# Patient Record
Sex: Female | Born: 1960 | Race: White | Hispanic: No | Smoking: Former smoker
Health system: Southern US, Community
[De-identification: ages and names within clinical notes are randomized; demographics above are authoritative.]

## PROBLEM LIST (undated history)

## (undated) HISTORY — PX: APPENDECTOMY: SHX54

## (undated) HISTORY — PX: TUBAL LIGATION: SHX77

## (undated) HISTORY — PX: CHOLECYSTECTOMY: SHX55

## (undated) HISTORY — PX: ABDOMINAL HYSTERECTOMY: SHX81

## (undated) HISTORY — PX: FRACTURE SURGERY: SHX138

---

## 2004-01-14 ENCOUNTER — Emergency Department: Payer: Self-pay | Admitting: Emergency Medicine

## 2004-01-20 ENCOUNTER — Emergency Department: Payer: Self-pay | Admitting: General Practice

## 2004-02-13 ENCOUNTER — Emergency Department: Payer: Self-pay | Admitting: Emergency Medicine

## 2004-03-03 ENCOUNTER — Ambulatory Visit: Payer: Self-pay | Admitting: General Surgery

## 2004-03-11 ENCOUNTER — Emergency Department: Payer: Self-pay | Admitting: Emergency Medicine

## 2006-08-26 ENCOUNTER — Emergency Department: Payer: Self-pay | Admitting: Emergency Medicine

## 2008-07-02 ENCOUNTER — Emergency Department: Payer: Self-pay | Admitting: Emergency Medicine

## 2010-09-23 ENCOUNTER — Emergency Department: Payer: Self-pay | Admitting: Emergency Medicine

## 2010-11-12 ENCOUNTER — Emergency Department: Payer: Self-pay | Admitting: *Deleted

## 2011-01-04 ENCOUNTER — Emergency Department: Payer: Self-pay | Admitting: Emergency Medicine

## 2011-02-26 ENCOUNTER — Emergency Department: Payer: Self-pay | Admitting: *Deleted

## 2013-04-19 ENCOUNTER — Emergency Department: Payer: Self-pay | Admitting: Emergency Medicine

## 2013-04-19 LAB — URINALYSIS, COMPLETE
BACTERIA: NONE SEEN
Bilirubin,UR: NEGATIVE
GLUCOSE, UR: NEGATIVE mg/dL (ref 0–75)
Ketone: NEGATIVE
NITRITE: NEGATIVE
PH: 5 (ref 4.5–8.0)
PROTEIN: NEGATIVE
RBC,UR: <1 /HPF (ref 0–5)
Specific Gravity: 1.019 (ref 1.003–1.030)
WBC UR: 3 /HPF (ref 0–5)

## 2013-04-19 LAB — COMPREHENSIVE METABOLIC PANEL
ALK PHOS: 69 U/L
Albumin: 3.9 g/dL (ref 3.4–5.0)
Anion Gap: 0 — ABNORMAL LOW (ref 7–16)
BUN: 16 mg/dL (ref 7–18)
Bilirubin,Total: 0.4 mg/dL (ref 0.2–1.0)
CO2: 31 mmol/L (ref 21–32)
Calcium, Total: 9.3 mg/dL (ref 8.5–10.1)
Chloride: 107 mmol/L (ref 98–107)
Creatinine: 0.89 mg/dL (ref 0.60–1.30)
EGFR (African American): >60
EGFR (Non-African Amer.): >60
Glucose: 91 mg/dL (ref 65–99)
OSMOLALITY: 276 (ref 275–301)
POTASSIUM: 3.4 mmol/L — AB (ref 3.5–5.1)
SGOT(AST): 24 U/L (ref 15–37)
SGPT (ALT): 22 U/L (ref 12–78)
SODIUM: 138 mmol/L (ref 136–145)
Total Protein: 8 g/dL (ref 6.4–8.2)

## 2013-04-19 LAB — CBC WITH DIFFERENTIAL/PLATELET
Basophil #: 0.1 10*3/uL (ref 0.0–0.1)
Basophil %: 1.2 %
EOS PCT: 1.7 %
Eosinophil #: 0.1 10*3/uL (ref 0.0–0.7)
HCT: 36.5 % (ref 35.0–47.0)
HGB: 12.4 g/dL (ref 12.0–16.0)
LYMPHS PCT: 23.4 %
Lymphocyte #: 1.8 10*3/uL (ref 1.0–3.6)
MCH: 33.4 pg (ref 26.0–34.0)
MCHC: 33.9 g/dL (ref 32.0–36.0)
MCV: 99 fL (ref 80–100)
Monocyte #: 0.5 x10 3/mm (ref 0.2–0.9)
Monocyte %: 6.4 %
Neutrophil #: 5.1 10*3/uL (ref 1.4–6.5)
Neutrophil %: 67.3 %
Platelet: 243 10*3/uL (ref 150–440)
RBC: 3.7 10*6/uL — AB (ref 3.80–5.20)
RDW: 13.3 % (ref 11.5–14.5)
WBC: 7.6 10*3/uL (ref 3.6–11.0)

## 2013-06-20 ENCOUNTER — Emergency Department: Payer: Self-pay | Admitting: Emergency Medicine

## 2013-06-20 LAB — CBC WITH DIFFERENTIAL/PLATELET
BASOS ABS: 0.1 10*3/uL (ref 0.0–0.1)
BASOS PCT: 1.1 %
Eosinophil #: 0.2 10*3/uL (ref 0.0–0.7)
Eosinophil %: 1.8 %
HCT: 40.8 % (ref 35.0–47.0)
HGB: 12.6 g/dL (ref 12.0–16.0)
Lymphocyte #: 1.1 10*3/uL (ref 1.0–3.6)
Lymphocyte %: 11.2 %
MCH: 30.5 pg (ref 26.0–34.0)
MCHC: 31 g/dL — ABNORMAL LOW (ref 32.0–36.0)
MCV: 99 fL (ref 80–100)
MONOS PCT: 5 %
Monocyte #: 0.5 x10 3/mm (ref 0.2–0.9)
NEUTROS PCT: 80.9 %
Neutrophil #: 8.3 10*3/uL — ABNORMAL HIGH (ref 1.4–6.5)
PLATELETS: 374 10*3/uL (ref 150–440)
RBC: 4.14 10*6/uL (ref 3.80–5.20)
RDW: 14.5 % (ref 11.5–14.5)
WBC: 10.3 10*3/uL (ref 3.6–11.0)

## 2013-06-20 LAB — COMPREHENSIVE METABOLIC PANEL
ALBUMIN: 4 g/dL (ref 3.4–5.0)
ALT: 23 U/L (ref 12–78)
ANION GAP: 6 — AB (ref 7–16)
Alkaline Phosphatase: 72 U/L
BUN: 22 mg/dL — AB (ref 7–18)
Bilirubin,Total: 0.6 mg/dL (ref 0.2–1.0)
CREATININE: 1.07 mg/dL (ref 0.60–1.30)
Calcium, Total: 9.6 mg/dL (ref 8.5–10.1)
Chloride: 102 mmol/L (ref 98–107)
Co2: 28 mmol/L (ref 21–32)
EGFR (African American): >60
EGFR (Non-African Amer.): >60
Glucose: 109 mg/dL — ABNORMAL HIGH (ref 65–99)
OSMOLALITY: 276 (ref 275–301)
POTASSIUM: 3.9 mmol/L (ref 3.5–5.1)
SGOT(AST): 24 U/L (ref 15–37)
SODIUM: 136 mmol/L (ref 136–145)
Total Protein: 8.8 g/dL — ABNORMAL HIGH (ref 6.4–8.2)

## 2014-04-09 ENCOUNTER — Emergency Department: Payer: Self-pay | Admitting: Student

## 2014-08-07 ENCOUNTER — Emergency Department
Admission: EM | Admit: 2014-08-07 | Discharge: 2014-08-07 | Disposition: A | Discharge status: Home / Self Care | Payer: Self-pay | Attending: Emergency Medicine | Admitting: Emergency Medicine

## 2014-08-07 ENCOUNTER — Encounter: Payer: Self-pay | Admitting: Emergency Medicine

## 2014-08-07 DIAGNOSIS — K088 Other specified disorders of teeth and supporting structures: Secondary | ICD-10-CM | POA: Insufficient documentation

## 2014-08-07 DIAGNOSIS — K089 Disorder of teeth and supporting structures, unspecified: Secondary | ICD-10-CM

## 2014-08-07 DIAGNOSIS — G8929 Other chronic pain: Secondary | ICD-10-CM | POA: Insufficient documentation

## 2014-08-07 MED ORDER — PENICILLIN V POTASSIUM 250 MG PO TABS: 250.0000 mg | ORAL_TABLET | Freq: Three times a day (TID) | ORAL | Status: DC

## 2014-08-07 MED ORDER — TRAMADOL HCL 50 MG PO TABS: 50.0000 mg | ORAL_TABLET | Freq: Four times a day (QID) | ORAL | Status: DC | PRN

## 2014-08-07 NOTE — ED Provider Notes (Signed)
[facility] Emergency Department Provider Note  ____________________________________________  Time seen: Approximately 10:21 AM  I have reviewed the triage vital signs and the nursing notes.   HISTORY  Chief Complaint Dental Pain    HPI Ariana GurneyKimberly A Cook is a 54 y.o. female who presents to the emergency department for chronic dental pain. She states the pain in the right lower jaw has become worse over the past couple weeks. She has been unable to get to the dentist due to transportation and insurance issues.   History reviewed. No pertinent past medical history.  There are no active problems to display for this patient.   Past Surgical History  Procedure Laterality Date  . Abdominal hysterectomy      Current Outpatient Rx  Name  Route  Sig  Dispense  Refill  . penicillin v potassium (VEETID) 250 MG tablet   Oral   Take 1 tablet (250 mg total) by mouth 3 (three) times daily.   30 tablet   0   . traMADol (ULTRAM) 50 MG tablet   Oral   Take 1 tablet (50 mg total) by mouth every 6 (six) hours as needed.   9 tablet   0     Allergies Review of patient's allergies indicates no known allergies.  No family history on file.  Social History History  Substance Use Topics  . Smoking status: Never Smoker   . Smokeless tobacco: Not on file  . Alcohol Use: No    Review of Systems Constitutional: No fever/chills ENT: No sore throat. Cardiovascular: Denies chest pain. Respiratory: Denies shortness of breath. Gastrointestinal: No abdominal pain.  No nausea, no vomiting.  No diarrhea.  No constipation. Musculoskeletal: Negative for pain. Skin: Negative for rash. Neurological: Negative for headaches, focal weakness or numbness.  10-point ROS otherwise negative.  ____________________________________________   PHYSICAL EXAM:  VITAL SIGNS: ED Triage Vitals  Enc Vitals Group     BP 08/07/14 0951 130/84 mmHg     Pulse Rate 08/07/14 0951 88      Resp 08/07/14 0951 18     Temp 08/07/14 0951 97.8 F (36.6 C)     Temp Source 08/07/14 0951 Oral     SpO2 08/07/14 0951 100 %     Weight 08/07/14 0951 164 lb (74.39 kg)     Height 08/07/14 0951 5\' 2"  (1.575 m)     Head Cir --      Peak Flow --      Pain Score 08/07/14 1000 10     Pain Loc --      Pain Edu? --      Excl. in GC? --     Constitutional: Alert and oriented. Well appearing and in no acute distress. Eyes: Conjunctivae are normal. PERRL. EOMI. Head: Atraumatic. Nose: No congestion/rhinnorhea. Mouth/Throat: Mucous membranes are moist.  Oropharynx non-erythematous. Widespread chronic dental decay without obvious abscess. No facial swelling. Neck: No stridor.   Cardiovascular:  Good peripheral circulation. Respiratory: Normal respiratory effort.  No retractions. Musculoskeletal: No lower extremity tenderness nor edema.   Neurologic:  Normal speech and language. No gross focal neurologic deficits are appreciated. Speech is normal. No gait instability. Skin:  Skin is warm, dry and intact. No rash noted. Psychiatric: Mood and affect are normal. Speech and behavior are normal.  ____________________________________________   LABS (all labs ordered are listed, but only abnormal results are displayed)  Labs Reviewed - No data to display ____________________________________________  EKG   ____________________________________________  RADIOLOGY  ____________________________________________   PROCEDURES  Procedure(s) performed: None  Critical Care performed: No  ____________________________________________   INITIAL IMPRESSION / ASSESSMENT AND PLAN / ED COURSE  Pertinent labs & imaging results that were available during my care of the patient were reviewed by me and considered in my medical decision making (see chart for details).  Patient was advised to follow-up with the dentist as soon as possible. She advises she plans to take a bus to [facility] next  Wednesday. She was advised to return to emergency department for symptoms that change or worsen if she is unable to schedule an appointment. ____________________________________________   FINAL CLINICAL IMPRESSION(S) / ED DIAGNOSES  Final diagnoses:  Chronic dental pain      Ariana Pester, FNP 08/07/14 1038

## 2014-08-07 NOTE — ED Notes (Signed)
Pt with dental pain for two weeks. Unable to see dentist d/t insurance.

## 2015-05-24 ENCOUNTER — Emergency Department
Admission: EM | Admit: 2015-05-24 | Discharge: 2015-05-24 | Disposition: A | Discharge status: Home / Self Care | Payer: Self-pay | Attending: Emergency Medicine | Admitting: Emergency Medicine

## 2015-05-24 ENCOUNTER — Emergency Department: Payer: Self-pay

## 2015-05-24 ENCOUNTER — Encounter: Payer: Self-pay | Admitting: Medical Oncology

## 2015-05-24 DIAGNOSIS — M25562 Pain in left knee: Secondary | ICD-10-CM | POA: Insufficient documentation

## 2015-05-24 DIAGNOSIS — Z792 Long term (current) use of antibiotics: Secondary | ICD-10-CM | POA: Insufficient documentation

## 2015-05-24 DIAGNOSIS — Z90710 Acquired absence of both cervix and uterus: Secondary | ICD-10-CM | POA: Insufficient documentation

## 2015-05-24 MED ORDER — NAPROXEN 500 MG PO TABS: 500.0000 mg | ORAL_TABLET | Freq: Two times a day (BID) | ORAL | Status: DC

## 2015-05-24 MED ORDER — OXYCODONE-ACETAMINOPHEN 5-325 MG PO TABS: 1.0000 | ORAL_TABLET | ORAL | Status: DC | PRN

## 2015-05-24 MED ORDER — TRAMADOL HCL 50 MG PO TABS
50.0000 mg | ORAL_TABLET | Freq: Once | ORAL | Status: AC
  Administered 2015-05-24: 50 mg via ORAL
  Filled 2015-05-24: qty 1

## 2015-05-24 MED ORDER — NAPROXEN 500 MG PO TABS
500.0000 mg | ORAL_TABLET | Freq: Once | ORAL | Status: AC
  Administered 2015-05-24: 500 mg via ORAL
  Filled 2015-05-24: qty 1

## 2015-05-24 NOTE — ED Provider Notes (Signed)
[facility] Emergency Department Provider Note  ____________________________________________  Time seen: Approximately 3:39 PM  I have reviewed the triage vital signs and the nursing notes.   HISTORY  Chief Complaint Knee Pain    HPI TEMPERENCE ZENOR is a 55 y.o. female patient complaining of 6 days of left inferior anterior knee pain. Patient denies any provocative incident for this pain. Patient stated the pain has increased in the past 2 days. Patient state the pain causes nausea. Patient describes a sharp pain that starts at the anterior medial aspect of the knee and radiates down her leg. Patient rates the pain as a 10 over 10. No palliative measures taken for this complaint.No complaint with the right knee.   History reviewed. No pertinent past medical history.  There are no active problems to display for this patient.   Past Surgical History  Procedure Laterality Date  . Abdominal hysterectomy      Current Outpatient Rx  Name  Route  Sig  Dispense  Refill  . naproxen (NAPROSYN) 500 MG tablet   Oral   Take 1 tablet (500 mg total) by mouth 2 (two) times daily with a meal.   20 tablet   0   . oxyCODONE-acetaminophen (ROXICET) 5-325 MG tablet   Oral   Take 1 tablet by mouth every 4 (four) hours as needed for severe pain.   30 tablet   0   . penicillin v potassium (VEETID) 250 MG tablet   Oral   Take 1 tablet (250 mg total) by mouth 3 (three) times daily.   30 tablet   0   . traMADol (ULTRAM) 50 MG tablet   Oral   Take 1 tablet (50 mg total) by mouth every 6 (six) hours as needed.   9 tablet   0     Allergies Review of patient's allergies indicates no known allergies.  No family history on file.  Social History Social History  Substance Use Topics  . Smoking status: Never Smoker   . Smokeless tobacco: None  . Alcohol Use: No    Review of Systems Constitutional: No fever/chills Eyes: No visual changes. ENT: No sore  throat. Cardiovascular: Denies chest pain. Respiratory: Denies shortness of breath. Gastrointestinal: No abdominal pain.  No nausea, no vomiting.  No diarrhea.  No constipation. Genitourinary: Negative for dysuria. Musculoskeletal: Left knee pain  Skin: Negative for rash. Neurological: Negative for headaches, focal weakness or numbness.    ____________________________________________   PHYSICAL EXAM:  VITAL SIGNS: ED Triage Vitals  Enc Vitals Group     BP 05/24/15 1505 148/87 mmHg     Pulse Rate 05/24/15 1505 95     Resp 05/24/15 1505 17     Temp 05/24/15 1505 98.1 F (36.7 C)     Temp Source 05/24/15 1505 Oral     SpO2 05/24/15 1505 98 %     Weight 05/24/15 1505 164 lb (74.39 kg)     Height 05/24/15 1505  (1.575 m)     Head Cir --      Peak Flow --      Pain Score 05/24/15 1505 10     Pain Loc --      Pain Edu? --      Excl. in GC? --     Constitutional: Alert and oriented. Well appearing and in no acute distress. Eyes: Conjunctivae are normal. PERRL. EOMI. Head: Atraumatic. Nose: No congestion/rhinnorhea. Mouth/Throat: Mucous membranes are moist.  Oropharynx non-erythematous. Neck: No  stridor.  No cervical spine tenderness to palpation. Hematological/Lymphatic/Immunilogical: No cervical lymphadenopathy. Cardiovascular: Normal rate, regular rhythm. Grossly normal heart sounds.  Good peripheral circulation. Respiratory: Normal respiratory effort.  No retractions. Lungs CTAB. Gastrointestinal: Soft and nontender. No distention. No abdominal bruits. No CVA tenderness. Musculoskeletal: No lower extremity tenderness nor edema.  No joint effusions. Neurologic:  Normal speech and language. No gross focal neurologic deficits are appreciated. No gait instability. Skin:  Skin is warm, dry and intact. No rash noted. Psychiatric: Mood and affect are normal. Speech and behavior are normal.  ____________________________________________   LABS (all labs ordered are  listed, but only abnormal results are displayed)  Labs Reviewed - No data to display ____________________________________________  EKG   ____________________________________________  RADIOLOGY  No acute findings on x-ray ____________________________________________   PROCEDURES  Procedure(s) performed: None  Critical Care performed: No  ____________________________________________   INITIAL IMPRESSION / ASSESSMENT AND PLAN / ED COURSE  Pertinent labs & imaging results that were available during my care of the patient were reviewed by me and considered in my medical decision making (see chart for details).  Arthralgia left knee. Discussed negative x-ray finding with patient. Advised patient to purchase elastic knee support with a 3-5 days. Patient given a prescription for naproxen, Percocet and advised [facility] if [facility] condition persists.   FINAL CLINICAL IMPRESSION(S) / ED DIAGNOSES  Final diagnoses:  Patellofemoral arthralgia of left knee      Joni ReiningRonald K Smith, PA-C 05/24/15 1724  Myrna Blazeravid Matthew Schaevitz, MD 05/25/15 [phone]

## 2015-05-24 NOTE — Discharge Instructions (Signed)
Advised last knee support for 3-5 days.

## 2015-05-24 NOTE — ED Notes (Signed)
Pt ambulatory with reports of left knee pain since Monday. Denies injury.

## 2017-05-23 ENCOUNTER — Encounter: Payer: Self-pay | Admitting: Emergency Medicine

## 2017-05-23 ENCOUNTER — Other Ambulatory Visit: Payer: Self-pay

## 2017-05-23 ENCOUNTER — Emergency Department
Admission: EM | Admit: 2017-05-23 | Discharge: 2017-05-23 | Disposition: A | Discharge status: Home / Self Care | Payer: Self-pay | Attending: Emergency Medicine | Admitting: Emergency Medicine

## 2017-05-23 DIAGNOSIS — L600 Ingrowing nail: Secondary | ICD-10-CM | POA: Insufficient documentation

## 2017-05-23 MED ORDER — IBUPROFEN 600 MG PO TABS: 600.0000 mg | ORAL_TABLET | Freq: Four times a day (QID) | ORAL | 0 refills | Status: DC | PRN

## 2017-05-23 MED ORDER — SULFAMETHOXAZOLE-TRIMETHOPRIM 800-160 MG PO TABS
1.0000 | ORAL_TABLET | Freq: Once | ORAL | Status: AC
  Administered 2017-05-23: 1 via ORAL
  Filled 2017-05-23: qty 1

## 2017-05-23 MED ORDER — OXYCODONE-ACETAMINOPHEN 5-325 MG PO TABS: 1.0000 | ORAL_TABLET | Freq: Four times a day (QID) | ORAL | 0 refills | Status: DC | PRN

## 2017-05-23 MED ORDER — OXYCODONE-ACETAMINOPHEN 5-325 MG PO TABS
1.0000 | ORAL_TABLET | Freq: Once | ORAL | Status: AC
  Administered 2017-05-23: 1 via ORAL
  Filled 2017-05-23: qty 1

## 2017-05-23 MED ORDER — IBUPROFEN 600 MG PO TABS
600.0000 mg | ORAL_TABLET | Freq: Once | ORAL | Status: AC
  Administered 2017-05-23: 600 mg via ORAL
  Filled 2017-05-23: qty 1

## 2017-05-23 MED ORDER — SULFAMETHOXAZOLE-TRIMETHOPRIM 800-160 MG PO TABS: 1.0000 | ORAL_TABLET | Freq: Two times a day (BID) | ORAL | 0 refills | Status: DC

## 2017-05-23 NOTE — ED Provider Notes (Signed)
[facility] Emergency Department Provider Note   ____________________________________________   First MD Initiated Contact with Patient 05/23/17 1116     (approximate)  I have reviewed the triage vital signs and the nursing notes.   HISTORY  Chief Complaint Toe Pain    HPI Ariana Cook is a 57 y.o. female patient complaining of infected right ingrown toenail.  For 4 days.  Patient had no relief over-the-counter anti-inflammatory medications.  Patient denies any drainage at this time.  Patient states toe is red and swollen.  Patient rates the pain as a 10/10.  Patient described the pain is "burning".   History reviewed. No pertinent past medical history.  There are no active problems to display for this patient.   Past Surgical History:  Procedure Laterality Date  . ABDOMINAL HYSTERECTOMY      Prior to Admission medications   Medication Sig Start Date End Date Taking? Authorizing Provider  ibuprofen (ADVIL,MOTRIN) 600 MG tablet Take 1 tablet (600 mg total) by mouth every 6 (six) hours as needed. 05/23/17   Joni Reining, PA-C  naproxen (NAPROSYN) 500 MG tablet Take 1 tablet (500 mg total) by mouth 2 (two) times daily with a meal. 05/24/15   Joni Reining, PA-C  oxyCODONE-acetaminophen (PERCOCET/ROXICET) 5-325 MG tablet Take 1 tablet by mouth every 6 (six) hours as needed for severe pain. 05/23/17   Joni Reining, PA-C  oxyCODONE-acetaminophen (ROXICET) 5-325 MG tablet Take 1 tablet by mouth every 4 (four) hours as needed for severe pain. 05/24/15   Joni Reining, PA-C  penicillin v potassium (VEETID) 250 MG tablet Take 1 tablet (250 mg total) by mouth 3 (three) times daily. 08/07/14   Triplett, Rulon Eisenmenger B, FNP  sulfamethoxazole-trimethoprim (BACTRIM DS,SEPTRA DS) 800-160 MG tablet Take 1 tablet by mouth 2 (two) times daily. 05/23/17   Joni Reining, PA-C  traMADol (ULTRAM) 50 MG tablet Take 1 tablet (50 mg total) by mouth every 6 (six) hours as  needed. 08/07/14   Chinita Pester, FNP    Allergies Patient has no known allergies.  No family history on file.  Social History Social History   Tobacco Use  . Smoking status: Never Smoker  . Smokeless tobacco: Never Used  Substance Use Topics  . Alcohol use: No  . Drug use: No    Review of Systems Constitutional: No fever/chills Cardiovascular: Denies chest pain. Respiratory: Denies shortness of breath. Musculoskeletal: Right great toe pain Skin: Negative for rash.  Redness and swelling to the great right toe.    ____________________________________________   PHYSICAL EXAM:  VITAL SIGNS: ED Triage Vitals  Enc Vitals Group     BP 05/23/17 1056 (!) 155/100     Pulse Rate 05/23/17 1056 68     Resp 05/23/17 1056 14     Temp 05/23/17 1056 (!) 97.5 F (36.4 C)     Temp Source 05/23/17 1056 Oral     SpO2 05/23/17 1056 99 %     Weight 05/23/17 1057 163 lb (73.9 kg)     Height 05/23/17 1057 5\' 2"  (1.575 m)     Head Circumference --      Peak Flow --      Pain Score 05/23/17 1056 10     Pain Loc --      Pain Edu? --      Excl. in GC? --    Constitutional: Alert and oriented. Well appearing and in no acute distress. Cardiovascular: Normal rate, regular rhythm.  Grossly normal heart sounds.  Good peripheral circulation. Respiratory: Normal respiratory effort.  No retractions. Lungs CTAB. Skin: Erythematous and edematous right great toe. .  ____________________________________________   LABS (all labs ordered are listed, but only abnormal results are displayed)  Labs Reviewed - No data to display ____________________________________________  EKG   ____________________________________________  RADIOLOGY  ED MD interpretation:    Official radiology report(s): No results found.  ____________________________________________   PROCEDURES  Procedure(s) performed: None  Procedures  Critical Care performed:  No  ____________________________________________   INITIAL IMPRESSION / ASSESSMENT AND PLAN / ED COURSE  As part of my medical decision making, I reviewed the following data within the electronic MEDICAL RECORD NUMBER    Patient presents with pain and redness to the right great toe.  Findings consistent with infected toenail.  Patient given discharge care instructions.  Patient given prescription for Bactrim DS, Percocet, and ibuprofen.  Patient advised to follow-up with podiatry if no improvement in 1 week.      ____________________________________________   FINAL CLINICAL IMPRESSION(S) / ED DIAGNOSES  Final diagnoses:  Ingrown toenail of right foot with infection     ED Discharge Orders        Ordered    sulfamethoxazole-trimethoprim (BACTRIM DS,SEPTRA DS) 800-160 MG tablet  2 times daily     05/23/17 1121    oxyCODONE-acetaminophen (PERCOCET/ROXICET) 5-325 MG tablet  Every 6 hours PRN     05/23/17 1121    ibuprofen (ADVIL,MOTRIN) 600 MG tablet  Every 6 hours PRN     05/23/17 1121       Note:  This document was prepared using Dragon voice recognition software and may include unintentional dictation errors.    Joni ReiningSmith, Ronald K, PA-C 05/23/17 1129    Merrily Brittleifenbark, Neil, MD 05/23/17 2035

## 2017-05-23 NOTE — Discharge Instructions (Signed)
Advised Epson salt soak along with antibiotics and pain medication.  Follow-up with podiatry if no improvement in 1 week.

## 2017-05-23 NOTE — ED Notes (Signed)
See triage note  Presents with redness and swelling to left great toe for about 2 weeks  denies injury

## 2017-05-23 NOTE — ED Triage Notes (Signed)
First nurse --infected toenail with swelling.

## 2017-09-14 ENCOUNTER — Emergency Department: Payer: Self-pay

## 2017-09-14 ENCOUNTER — Emergency Department
Admission: EM | Admit: 2017-09-14 | Discharge: 2017-09-14 | Disposition: A | Discharge status: Other Acute Inpatient Hospital | Payer: Self-pay | Attending: Emergency Medicine | Admitting: Emergency Medicine

## 2017-09-14 ENCOUNTER — Other Ambulatory Visit: Payer: Self-pay

## 2017-09-14 DIAGNOSIS — S71111D Laceration without foreign body, right thigh, subsequent encounter: Secondary | ICD-10-CM | POA: Insufficient documentation

## 2017-09-14 DIAGNOSIS — M7989 Other specified soft tissue disorders: Secondary | ICD-10-CM

## 2017-09-14 DIAGNOSIS — T792XXA Traumatic secondary and recurrent hemorrhage and seroma, initial encounter: Secondary | ICD-10-CM | POA: Insufficient documentation

## 2017-09-14 LAB — CBC WITH DIFFERENTIAL/PLATELET
Basophils Absolute: 0.1 10*3/uL (ref 0–0.1)
Basophils Relative: 1 %
EOS PCT: 4 %
Eosinophils Absolute: 0.4 10*3/uL (ref 0–0.7)
HCT: 31.8 % — ABNORMAL LOW (ref 35.0–47.0)
Hemoglobin: 10.6 g/dL — ABNORMAL LOW (ref 12.0–16.0)
Lymphocytes Relative: 17 %
Lymphs Abs: 1.8 10*3/uL (ref 1.0–3.6)
MCH: 30.4 pg (ref 26.0–34.0)
MCHC: 33.2 g/dL (ref 32.0–36.0)
MCV: 91.6 fL (ref 80.0–100.0)
Monocytes Absolute: 0.7 10*3/uL (ref 0.2–0.9)
Monocytes Relative: 7 %
Neutro Abs: 7.8 10*3/uL — ABNORMAL HIGH (ref 1.4–6.5)
Neutrophils Relative %: 71 %
PLATELETS: 533 10*3/uL — AB (ref 150–440)
RBC: 3.48 MIL/uL — AB (ref 3.80–5.20)
RDW: 15.6 % — ABNORMAL HIGH (ref 11.5–14.5)
WBC: 10.8 10*3/uL (ref 3.6–11.0)

## 2017-09-14 LAB — BASIC METABOLIC PANEL
Anion gap: 10 (ref 5–15)
BUN: 15 mg/dL (ref 6–20)
CHLORIDE: 103 mmol/L (ref 98–111)
CO2: 27 mmol/L (ref 22–32)
Calcium: 9.1 mg/dL (ref 8.9–10.3)
Creatinine, Ser: 0.8 mg/dL (ref 0.44–1.00)
GFR calc Af Amer: >60 mL/min (ref >60)
GFR calc non Af Amer: >60 mL/min (ref >60)
GLUCOSE: 101 mg/dL — AB (ref 70–99)
Potassium: 3.6 mmol/L (ref 3.5–5.1)
SODIUM: 140 mmol/L (ref 135–145)

## 2017-09-14 LAB — PROTIME-INR
INR: 1.12
Prothrombin Time: 14.3 seconds (ref 11.4–15.2)

## 2017-09-14 MED ORDER — OXYCODONE-ACETAMINOPHEN 5-325 MG PO TABS
1.0000 | ORAL_TABLET | Freq: Once | ORAL | Status: AC
  Administered 2017-09-14: 1 via ORAL
  Filled 2017-09-14: qty 1

## 2017-09-14 NOTE — ED Notes (Signed)
EMTALA reviewed by charge RN 

## 2017-09-14 NOTE — ED Notes (Signed)
[facility]  [phone]
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
## 2017-09-14 NOTE — ED Notes (Signed)
Called [facility] Transfer center for transfer  [phone] 
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
## 2017-09-27 ENCOUNTER — Emergency Department
Admission: EM | Admit: 2017-09-27 | Discharge: 2017-09-27 | Disposition: A | Discharge status: Other Acute Inpatient Hospital | Payer: Self-pay | Attending: Emergency Medicine | Admitting: Emergency Medicine

## 2017-09-27 ENCOUNTER — Encounter: Payer: Self-pay | Admitting: Emergency Medicine

## 2017-09-27 ENCOUNTER — Emergency Department: Payer: Self-pay

## 2017-09-27 ENCOUNTER — Other Ambulatory Visit: Payer: Self-pay

## 2017-09-27 DIAGNOSIS — L03115 Cellulitis of right lower limb: Secondary | ICD-10-CM | POA: Insufficient documentation

## 2017-09-27 DIAGNOSIS — A419 Sepsis, unspecified organism: Secondary | ICD-10-CM | POA: Insufficient documentation

## 2017-09-27 DIAGNOSIS — N179 Acute kidney failure, unspecified: Secondary | ICD-10-CM | POA: Insufficient documentation

## 2017-09-27 LAB — COMPREHENSIVE METABOLIC PANEL
ALT: 13 U/L (ref 0–44)
AST: 33 U/L (ref 15–41)
Albumin: 3.6 g/dL (ref 3.5–5.0)
Alkaline Phosphatase: 107 U/L (ref 38–126)
Anion gap: 9 (ref 5–15)
BUN: 31 mg/dL — ABNORMAL HIGH (ref 6–20)
CHLORIDE: 102 mmol/L (ref 98–111)
CO2: 26 mmol/L (ref 22–32)
CREATININE: 1.57 mg/dL — AB (ref 0.44–1.00)
Calcium: 9.6 mg/dL (ref 8.9–10.3)
GFR calc non Af Amer: 36 mL/min — ABNORMAL LOW (ref >60)
GFR, EST AFRICAN AMERICAN: 42 mL/min — AB (ref >60)
Glucose, Bld: 112 mg/dL — ABNORMAL HIGH (ref 70–99)
Potassium: 4.6 mmol/L (ref 3.5–5.1)
SODIUM: 137 mmol/L (ref 135–145)
Total Bilirubin: 1 mg/dL (ref 0.3–1.2)
Total Protein: 8.8 g/dL — ABNORMAL HIGH (ref 6.5–8.1)

## 2017-09-27 LAB — URINALYSIS, COMPLETE (UACMP) WITH MICROSCOPIC
BILIRUBIN URINE: NEGATIVE
GLUCOSE, UA: NEGATIVE mg/dL
HGB URINE DIPSTICK: NEGATIVE
KETONES UR: NEGATIVE mg/dL
NITRITE: NEGATIVE
PH: 5 (ref 5.0–8.0)
Protein, ur: NEGATIVE mg/dL
Specific Gravity, Urine: 1.019 (ref 1.005–1.030)

## 2017-09-27 LAB — CBC WITH DIFFERENTIAL/PLATELET
Basophils Absolute: 0.1 10*3/uL (ref 0–0.1)
Basophils Relative: 1 %
EOS PCT: 4 %
Eosinophils Absolute: 0.4 10*3/uL (ref 0–0.7)
HCT: 33.4 % — ABNORMAL LOW (ref 35.0–47.0)
HEMOGLOBIN: 11.1 g/dL — AB (ref 12.0–16.0)
Lymphocytes Relative: 15 %
Lymphs Abs: 1.6 10*3/uL (ref 1.0–3.6)
MCH: 29.6 pg (ref 26.0–34.0)
MCHC: 33.2 g/dL (ref 32.0–36.0)
MCV: 89 fL (ref 80.0–100.0)
Monocytes Absolute: 0.8 10*3/uL (ref 0.2–0.9)
Monocytes Relative: 7 %
Neutro Abs: 8.2 10*3/uL — ABNORMAL HIGH (ref 1.4–6.5)
Neutrophils Relative %: 73 %
PLATELETS: 344 10*3/uL (ref 150–440)
RBC: 3.75 MIL/uL — AB (ref 3.80–5.20)
RDW: 15.7 % — ABNORMAL HIGH (ref 11.5–14.5)
WBC: 11.1 10*3/uL — AB (ref 3.6–11.0)

## 2017-09-27 LAB — PROTIME-INR
INR: 1.07
Prothrombin Time: 13.8 seconds (ref 11.4–15.2)

## 2017-09-27 LAB — LACTIC ACID, PLASMA: Lactic Acid, Venous: 1.7 mmol/L (ref 0.5–1.9)

## 2017-09-27 MED ORDER — MORPHINE SULFATE (PF) 4 MG/ML IV SOLN
4.0000 mg | Freq: Once | INTRAVENOUS | Status: AC
  Administered 2017-09-27: 4 mg via INTRAVENOUS
  Filled 2017-09-27: qty 1

## 2017-09-27 MED ORDER — ONDANSETRON HCL 4 MG/2ML IJ SOLN
4.0000 mg | Freq: Once | INTRAMUSCULAR | Status: AC
  Administered 2017-09-27: 4 mg via INTRAVENOUS
  Filled 2017-09-27: qty 2

## 2017-09-27 MED ORDER — SODIUM CHLORIDE 0.9 % IV BOLUS
1000.0000 mL | Freq: Once | INTRAVENOUS | Status: AC
  Administered 2017-09-27: 1000 mL via INTRAVENOUS

## 2017-09-27 MED ORDER — VANCOMYCIN HCL IN DEXTROSE 1-5 GM/200ML-% IV SOLN
1000.0000 mg | Freq: Once | INTRAVENOUS | Status: AC
  Administered 2017-09-27: 1000 mg via INTRAVENOUS
  Filled 2017-09-27: qty 200

## 2017-09-27 MED ORDER — PIPERACILLIN-TAZOBACTAM 3.375 G IVPB 30 MIN
3.3750 g | Freq: Once | INTRAVENOUS | Status: AC
  Administered 2017-09-27: 3.375 g via INTRAVENOUS
  Filled 2017-09-27: qty 50

## 2017-09-27 MED ORDER — PIPERACILLIN-TAZOBACTAM 3.375 G IVPB
INTRAVENOUS | Status: AC
  Filled 2017-09-27: qty 50

## 2017-09-27 NOTE — ED Notes (Signed)
EMTALA reviewed by this RN.  
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
## 2017-09-27 NOTE — Progress Notes (Signed)
CODE SEPSIS - PHARMACY COMMUNICATION  **Broad Spectrum Antibiotics should be administered within 1 hour of Sepsis diagnosis**  Time Code Sepsis Called/Page Received: @ 2108  Antibiotics Ordered: Zosyn 3.375                                      Vancomycin 1g  Time of 1st antibiotic administration: 2103  Additional action taken by pharmacy: N/A  If necessary, Name of Provider/Nurse Contacted: N/A   Gardner CandleSheema M Marika Mahaffy, PharmD, BCPS Clinical Pharmacist 09/27/2017 9:13 PM

## 2017-09-27 NOTE — ED Notes (Signed)
Talked with [place] for EMS.

## 2017-09-27 NOTE — ED Notes (Signed)
Fluids continue to infuse with EMS.
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
## 2017-09-27 NOTE — ED Notes (Signed)
Pt [facility] due to [facility] of care. Pt is red in the face and is cold. Pt has redness and swelling to her right foot and leg.

## 2017-10-01 MED ORDER — ENOXAPARIN SODIUM 40 MG/0.4ML ~~LOC~~ SOLN
40.00 | SUBCUTANEOUS | Status: DC
Start: 2017-10-02 — End: 2017-10-01

## 2017-10-01 MED ORDER — GENERIC EXTERNAL MEDICATION
Status: DC
Start: ? — End: 2017-10-01

## 2017-10-01 MED ORDER — GABAPENTIN 300 MG PO CAPS
900.00 | ORAL_CAPSULE | ORAL | Status: DC
Start: 2017-10-01 — End: 2017-10-01

## 2017-10-01 MED ORDER — POLYETHYLENE GLYCOL 3350 17 G PO PACK
17.00 | PACK | ORAL | Status: DC
Start: 2017-10-01 — End: 2017-10-01

## 2017-10-01 MED ORDER — DULOXETINE HCL 30 MG PO CPEP
30.00 | ORAL_CAPSULE | ORAL | Status: DC
Start: 2017-10-01 — End: 2017-10-01

## 2017-10-01 MED ORDER — GENERIC EXTERNAL MEDICATION
2.00 | Status: DC
Start: 2017-10-01 — End: 2017-10-01

## 2017-10-01 MED ORDER — ASPIRIN 81 MG PO CHEW
81.00 | CHEWABLE_TABLET | ORAL | Status: DC
Start: 2017-10-01 — End: 2017-10-01

## 2017-10-01 MED ORDER — CEFEPIME HCL 2 GM/100ML IV SOLN
2.00 | INTRAVENOUS | Status: DC
Start: 2017-10-01 — End: 2017-10-01

## 2017-10-02 LAB — CULTURE, BLOOD (ROUTINE X 2)
CULTURE: NO GROWTH
CULTURE: NO GROWTH
SPECIAL REQUESTS: ADEQUATE
Special Requests: ADEQUATE

## 2017-11-19 ENCOUNTER — Other Ambulatory Visit: Payer: Self-pay

## 2017-11-19 ENCOUNTER — Encounter: Payer: Self-pay | Admitting: Emergency Medicine

## 2017-11-19 ENCOUNTER — Emergency Department
Admission: EM | Admit: 2017-11-19 | Discharge: 2017-11-19 | Disposition: A | Discharge status: Home / Self Care | Payer: Self-pay | Attending: Emergency Medicine | Admitting: Emergency Medicine

## 2017-11-19 DIAGNOSIS — Z711 Person with feared health complaint in whom no diagnosis is made: Secondary | ICD-10-CM | POA: Insufficient documentation

## 2017-11-19 NOTE — ED Notes (Signed)
Pt left with discharge instructions

## 2017-11-19 NOTE — ED Provider Notes (Signed)
[facility] Emergency Department Provider Note  ____________________________________________  Time seen: Approximately 9:44 PM  I have reviewed the triage vital signs and the nursing notes.   HISTORY  Chief Complaint Foreign Body in Vagina    HPI Raymond GurneyKimberly A Hott is a 57 y.o. female presents to the emergency department with concern for a vaginal foreign body.  Patient reports that she has been using a diaphragm and is concerned that she might have forgotten it.  Patient reports the root of her concern is due to the fact that her long-term boyfriend feels like her vagina smells too "clean".  Patient's boyfriend also states that she feels "more rubbery than usual".  No changes in vaginal discharge, dysuria, hematuria or flank pain. No alleviating measures have been attempted.    History reviewed. No pertinent past medical history.  There are no active problems to display for this patient.   Past Surgical History:  Procedure Laterality Date  . ABDOMINAL HYSTERECTOMY    . FRACTURE SURGERY      Prior to Admission medications   Medication Sig Start Date End Date Taking? Authorizing Provider  ibuprofen (ADVIL,MOTRIN) 600 MG tablet Take 1 tablet (600 mg total) by mouth every 6 (six) hours as needed. Patient not taking: Reported on 09/14/2017 05/23/17   Joni ReiningSmith, Ronald K, PA-C  naproxen (NAPROSYN) 500 MG tablet Take 1 tablet (500 mg total) by mouth 2 (two) times daily with a meal. Patient not taking: Reported on 09/14/2017 05/24/15   Joni ReiningSmith, Ronald K, PA-C  oxyCODONE-acetaminophen (PERCOCET/ROXICET) 5-325 MG tablet Take 1 tablet by mouth every 6 (six) hours as needed for severe pain. Patient not taking: Reported on 09/14/2017 05/23/17   Joni ReiningSmith, Ronald K, PA-C  oxyCODONE-acetaminophen (ROXICET) 5-325 MG tablet Take 1 tablet by mouth every 4 (four) hours as needed for severe pain. Patient not taking: Reported on 09/14/2017 05/24/15   Joni ReiningSmith, Ronald K, PA-C  penicillin v  potassium (VEETID) 250 MG tablet Take 1 tablet (250 mg total) by mouth 3 (three) times daily. Patient not taking: Reported on 09/14/2017 08/07/14   Kem Boroughsriplett, Cari B, FNP  sulfamethoxazole-trimethoprim (BACTRIM DS,SEPTRA DS) 800-160 MG tablet Take 1 tablet by mouth 2 (two) times daily. Patient not taking: Reported on 09/14/2017 05/23/17   Joni ReiningSmith, Ronald K, PA-C  traMADol (ULTRAM) 50 MG tablet Take 1 tablet (50 mg total) by mouth every 6 (six) hours as needed. Patient not taking: Reported on 09/14/2017 08/07/14   Chinita Pesterriplett, Cari B, FNP    Allergies Patient has no known allergies.  History reviewed. No pertinent family history.  Social History Social History   Tobacco Use  . Smoking status: Never Smoker  . Smokeless tobacco: Never Used  Substance Use Topics  . Alcohol use: No  . Drug use: No     Review of Systems  Constitutional: No fever/chills Eyes: No visual changes. No discharge ENT: No upper respiratory complaints. Cardiovascular: no chest pain. Respiratory: no cough. No SOB. Gastrointestinal: No abdominal pain.  No nausea, no vomiting.  No diarrhea.  No constipation. Genitourinary: Negative for dysuria. No hematuria Musculoskeletal: Negative for musculoskeletal pain. Skin: Negative for rash, abrasions, lacerations, ecchymosis. Neurological: Negative for headaches, focal weakness or numbness.   ____________________________________________   PHYSICAL EXAM:  VITAL SIGNS: ED Triage Vitals  Enc Vitals Group     BP 11/19/17 1649 (!) 167/94     Pulse Rate 11/19/17 1649 97     Resp 11/19/17 1649 16     Temp 11/19/17 1649 98.7 F (37.1  C)     Temp Source 11/19/17 1649 Oral     SpO2 11/19/17 1649 97 %     Weight 11/19/17 1647 159 lb (72.1 kg)     Height 11/19/17 1647 5\' 2"  (1.575 m)     Head Circumference --      Peak Flow --      Pain Score 11/19/17 1647 0     Pain Loc --      Pain Edu? --      Excl. in GC? --      Constitutional: Alert and oriented. Well appearing and  in no acute distress. Eyes: Conjunctivae are normal. PERRL. EOMI. Head: Atraumatic. Cardiovascular: Normal rate, regular rhythm. Normal S1 and S2.  Good peripheral circulation. Respiratory: Normal respiratory effort without tachypnea or retractions. Lungs CTAB. Good air entry to the bases with no decreased or absent breath sounds. Gastrointestinal: Bowel sounds 4 quadrants. Soft and nontender to palpation. No guarding or rigidity. No palpable masses. No distention. No CVA tenderness. Genitourinary: No foreign bodies visualized in the vaginal vault.  Patient has mild atrophy of the labia majora. Musculoskeletal: Full range of motion to all extremities. No gross deformities appreciated. Neurologic:  Normal speech and language. No gross focal neurologic deficits are appreciated.  Skin:  Skin is warm, dry and intact. No rash noted. Psychiatric: Mood and affect are normal. Speech and behavior are normal. Patient exhibits appropriate insight and judgement.   ____________________________________________   LABS (all labs ordered are listed, but only abnormal results are displayed)  Labs Reviewed - No data to display ____________________________________________  EKG   ____________________________________________  RADIOLOGY   No results found.  ____________________________________________    PROCEDURES  Procedure(s) performed:    Procedures    Medications - No data to display   ____________________________________________   INITIAL IMPRESSION / ASSESSMENT AND PLAN / ED COURSE  Pertinent labs & imaging results that were available during my care of the patient were reviewed by me and considered in my medical decision making (see chart for details).  Review of the [place] CSRS was performed in accordance of the [facility] prior to dispensing any controlled drugs.      Assessment and plan Feared Complaint without Diagnosis  Patient presents to the emergency department for concern  for vaginal foreign body.  No foreign body was visualized on inspection of the vaginal vault.  Greig Right, PA-C assisted with vaginal exam and she concurred that no foreign body was visible.  Patient was advised to follow-up with OB/GYN as needed.  All patient questions were answered.     ____________________________________________  FINAL CLINICAL IMPRESSION(S) / ED DIAGNOSES  Final diagnoses:  Feared complaint without diagnosis      NEW MEDICATIONS STARTED DURING THIS VISIT:  ED Discharge Orders    None          This chart was dictated using voice recognition software/Dragon. Despite best efforts to proofread, errors can occur which can change the meaning. Any change was purely unintentional.    Gasper Lloyd 11/19/17 2148    Nita Sickle, MD 11/24/17 2159

## 2017-11-19 NOTE — ED Triage Notes (Signed)
Pt used diaphragm and reports forgot it was in there and now it is stuck and her boyfriend reports it feels rubbery when they have sex.

## 2017-11-22 ENCOUNTER — Emergency Department
Admission: EM | Admit: 2017-11-22 | Discharge: 2017-11-22 | Disposition: A | Discharge status: Home / Self Care | Payer: Self-pay | Attending: Emergency Medicine | Admitting: Emergency Medicine

## 2017-11-22 ENCOUNTER — Encounter: Payer: Self-pay | Admitting: Emergency Medicine

## 2017-11-22 DIAGNOSIS — N898 Other specified noninflammatory disorders of vagina: Secondary | ICD-10-CM | POA: Insufficient documentation

## 2017-11-22 DIAGNOSIS — Z5321 Procedure and treatment not carried out due to patient leaving prior to being seen by health care provider: Secondary | ICD-10-CM | POA: Insufficient documentation
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
## 2017-11-22 NOTE — ED Triage Notes (Signed)
Pt states unsure of how long the plastic has been covering her clit, significant other reports it has been since July.

## 2018-01-17 ENCOUNTER — Other Ambulatory Visit: Payer: Self-pay

## 2018-01-17 ENCOUNTER — Encounter: Payer: Self-pay | Admitting: Emergency Medicine

## 2018-01-17 ENCOUNTER — Emergency Department
Admission: EM | Admit: 2018-01-17 | Discharge: 2018-01-17 | Disposition: A | Discharge status: Home / Self Care | Payer: Medicaid Other | Attending: Emergency Medicine | Admitting: Emergency Medicine

## 2018-01-17 DIAGNOSIS — Z87891 Personal history of nicotine dependence: Secondary | ICD-10-CM | POA: Insufficient documentation

## 2018-01-17 DIAGNOSIS — Z711 Person with feared health complaint in whom no diagnosis is made: Secondary | ICD-10-CM | POA: Insufficient documentation
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
## 2018-01-17 NOTE — Discharge Instructions (Signed)
Elevate leg as needed.  Take medication like over-the-counter Tylenol if needed for pain.  [facility] any continued problems.

## 2018-01-17 NOTE — ED Notes (Signed)
Pt states right leg feels "different", "like its swollen or something, it feels like the scar is cutting down deeper into my leg".  On observation pt has scar on right thigh from previous surgery. From scar up both legs appear symmetrical. Distal from scar leg mass is smaller than left leg but pt states it feels tight like area is swelling. Pt unable to provide clear answer regarding if the area is larger than normal, pt response "it just feels tight". No redness of obvious signs of infection at surgical site/scaring.

## 2018-01-17 NOTE — ED Provider Notes (Signed)
[facility] Emergency Department Provider Note  ____________________________________________   First MD Initiated Contact with Patient 01/17/18 1133     (approximate)  I have reviewed the triage vital signs and the nursing notes.   HISTORY  Chief Complaint Leg Pain   HPI Raymond GurneyKimberly A Cook is a 57 y.o. female presents to the ED with concerns of her right leg swelling.  She states that she had surgery due to fracture in June 2019.  Shortly after surgery they had to drain fluid with a wound VAC and she has been anxious about a collection of fluid since that time.  She denies any recent injury to her leg nor has she seen any redness or warmth.  She continues to be ambulatory without any assistance.  She rates her pain as an 8 out of 10.  History reviewed. No pertinent past medical history.  There are no active problems to display for this patient.   Past Surgical History:  Procedure Laterality Date  . ABDOMINAL HYSTERECTOMY    . FRACTURE SURGERY      Prior to Admission medications   Not on File    Allergies Patient has no known allergies.  No family history on file.  Social History Social History   Tobacco Use  . Smoking status: Former Smoker    Types: Cigarettes  . Smokeless tobacco: Never Used  Substance Use Topics  . Alcohol use: No  . Drug use: No    Review of Systems Constitutional: No fever/chills Cardiovascular: Denies chest pain. Respiratory: Denies shortness of breath. Musculoskeletal: Negative for back pain. Skin: Postsurgical changes. Neurological: Negative for headaches, focal weakness or numbness. ___________________________________________   PHYSICAL EXAM:  VITAL SIGNS: ED Triage Vitals  Enc Vitals Group     BP 01/17/18 1114 (!) 160/106     Pulse Rate 01/17/18 1114 96     Resp 01/17/18 1114 16     Temp 01/17/18 1114 (!) 97.5 F (36.4 C)     Temp Source 01/17/18 1114 Oral     SpO2 01/17/18 1114 98 %     Weight  01/17/18 1116 163 lb (73.9 kg)     Height 01/17/18 1116 5\' 2"  (1.575 m)     Head Circumference --      Peak Flow --      Pain Score 01/17/18 1114 8     Pain Loc --      Pain Edu? --      Excl. in GC? --    Constitutional: Alert and oriented. Well appearing and in no acute distress. Eyes: Conjunctivae are normal.  Head: Atraumatic. Neck: No stridor.   Cardiovascular: Normal rate, regular rhythm. Grossly normal heart sounds.  Good peripheral circulation. Respiratory: Normal respiratory effort.  No retractions. Lungs CTAB. Musculoskeletal: On examination of the right leg there is no gross deformity other than her postsurgical changes.  There is no erythema or warmth to suggest a DVT.  No point tenderness.  No edema is appreciated on examination. Neurologic:  Normal speech and language. No gross focal neurologic deficits are appreciated. No gait instability. Skin:  Skin is warm, dry and intact. No rash noted.  Patient has a well-healed surgical scar anterior thigh without drainage or erythema present. Psychiatric: Mood and affect are normal. Speech and behavior are normal.  ____________________________________________   LABS (all labs ordered are listed, but only abnormal results are displayed)  Labs Reviewed - No data to display   PROCEDURES  Procedure(s) performed: None  Procedures  Critical Care performed: No  ____________________________________________   INITIAL IMPRESSION / ASSESSMENT AND PLAN / ED COURSE  As part of my medical decision making, I reviewed the following data within the electronic MEDICAL RECORD NUMBER Notes from prior ED visits and [place] Controlled Substance Database  Patient presents to the ED with complaint of right leg swelling.  She is anxious as she had surgery for a fracture in June 2019 and had swelling postsurgical.  There is no recent injury.  Patient does not report any redness or warmth to her leg.  Examination is low suspicion for any soft tissue  swelling.  Well-healed surgical scar is present anteriorly with out any evidence of infection.  Patient is reassured and patient will follow-up with her PCP/surgeon at [facility] if any continued problems.  ____________________________________________   FINAL CLINICAL IMPRESSION(S) / ED DIAGNOSES  Final diagnoses:  Feared complaint without diagnosis     ED Discharge Orders    None       Note:  This document was prepared using Dragon voice recognition software and may include unintentional dictation errors.    Tommi Rumps, PA-C 01/17/18 1342    Governor Rooks, MD 01/17/18 [phone]

## 2018-01-17 NOTE — ED Notes (Signed)
FIRST NURSE NOTE:  Pt c/o right leg pain where surgical site was. Pt in wheelchair.

## 2018-03-12 ENCOUNTER — Emergency Department
Admission: EM | Admit: 2018-03-12 | Discharge: 2018-03-12 | Disposition: A | Discharge status: Home / Self Care | Payer: Medicaid Other | Attending: Emergency Medicine | Admitting: Emergency Medicine

## 2018-03-12 ENCOUNTER — Other Ambulatory Visit: Payer: Self-pay

## 2018-03-12 ENCOUNTER — Encounter: Payer: Self-pay | Admitting: Intensive Care

## 2018-03-12 DIAGNOSIS — H9202 Otalgia, left ear: Secondary | ICD-10-CM | POA: Insufficient documentation

## 2018-03-12 DIAGNOSIS — Y999 Unspecified external cause status: Secondary | ICD-10-CM | POA: Insufficient documentation

## 2018-03-12 DIAGNOSIS — X58XXXA Exposure to other specified factors, initial encounter: Secondary | ICD-10-CM | POA: Insufficient documentation

## 2018-03-12 DIAGNOSIS — Y9389 Activity, other specified: Secondary | ICD-10-CM | POA: Insufficient documentation

## 2018-03-12 DIAGNOSIS — S0993XA Unspecified injury of face, initial encounter: Secondary | ICD-10-CM

## 2018-03-12 DIAGNOSIS — Y929 Unspecified place or not applicable: Secondary | ICD-10-CM | POA: Insufficient documentation

## 2018-03-12 DIAGNOSIS — Z87891 Personal history of nicotine dependence: Secondary | ICD-10-CM | POA: Insufficient documentation

## 2018-03-12 DIAGNOSIS — S025XXA Fracture of tooth (traumatic), initial encounter for closed fracture: Secondary | ICD-10-CM | POA: Insufficient documentation

## 2018-03-12 MED ORDER — HYDROCODONE-ACETAMINOPHEN 5-325 MG PO TABS: 1.0000 | ORAL_TABLET | Freq: Four times a day (QID) | ORAL | 0 refills | Status: DC | PRN

## 2018-03-12 MED ORDER — AMOXICILLIN 500 MG PO CAPS: 500.0000 mg | ORAL_CAPSULE | Freq: Three times a day (TID) | ORAL | 0 refills | Status: DC

## 2018-03-12 MED ORDER — IBUPROFEN 600 MG PO TABS: 600.0000 mg | ORAL_TABLET | Freq: Three times a day (TID) | ORAL | 0 refills | Status: DC | PRN

## 2018-03-12 MED ORDER — HYDROCODONE-ACETAMINOPHEN 5-325 MG PO TABS
1.0000 | ORAL_TABLET | Freq: Once | ORAL | Status: AC
  Administered 2018-03-12: 1 via ORAL
  Filled 2018-03-12: qty 1

## 2018-03-12 NOTE — Discharge Instructions (Signed)
Call make an appointment with 1 the clinics listed on your discharge instructions.  Also the clinic at [facility] excepts walk-ins.  Take medication as prescribed.  OPTIONS FOR DENTAL FOLLOW UP CARE  [place] Department of Health and Human Services - Local Safety Net Dental Clinics [email or web address]://www.ncdhhs.gov/dph/oralhealth/services/safetynetclinics.htm   [facility] [phone])  [facility] [phone])  [facility] [phone]  [facility] [phone]  [facility] [phone]) This clinic caters to the indigent population and is on a lottery system. Location: [employer], [address], [place] Clinic Hours: Wednesdays from 6pm - 9pm, patients seen by a lottery system. For dates, call or go to [email or web address] Services: Cleanings, fillings and simple extractions. Payment Options: DENTAL WORK IS FREE OF CHARGE. Bring proof of income or support. Best way to get seen: Arrive at 5:15 pm - this is a lottery, NOT first come/first serve, so arriving earlier will not increase your chances of being seen.     [facility] [phone] Select option 1 for emergencies   Location: [facility], [address], [place] Clinic Hours: No walk-ins accepted - call the day before to schedule an appointment. Check in times are 9:30 am and 1:30 pm. Services: Simple extractions, temporary fillings, pulpectomy/pulp debridement, uncomplicated abscess drainage. Payment Options: PAYMENT IS DUE AT THE TIME OF SERVICE.  Fee is usually $100-200, additional surgical procedures (e.g. abscess drainage) may be extra. Cash, checks, Visa/MasterCard accepted.  Can file Medicaid if patient is covered for dental - patient should call case worker to check. No discount for [facility] patients. Best way to get seen: MUST  call the day before and get onto the schedule. Can usually be seen the next 1-2 days. No walk-ins accepted.     [facility] [phone]   Location: [facility], [address], [place] Clinic Hours: M, W, Th, F 8am or 1:30pm, Tues 9a or 1:30 - first come/first served. Services: Simple extractions, temporary fillings, uncomplicated abscess drainage.  You do not need to be an [facility] [place] resident. Payment Options: PAYMENT IS DUE AT THE TIME OF SERVICE. Dental insurance, otherwise sliding scale - bring proof of income or support. Depending on income and treatment needed, cost is usually $50-200. Best way to get seen: Arrive early as it is first come/first served.     [facility] [phone]   Location: [address] Clinic Hours: Mon-Thu 8a-5p Services: Most basic dental services including extractions and fillings. Payment Options: PAYMENT IS DUE AT THE TIME OF SERVICE. Sliding scale, up to 50% off - bring proof if income or support. Medicaid with dental option accepted. Best way to get seen: Call to schedule an appointment, can usually be seen within 2 weeks OR they will try to see walk-ins - show up at 8a or 2p (you may have to wait).     [facility] [phone] [place] RESIDENTS ONLY   Location: [facility], [address], [place], [place] [id number] Clinic Hours: By appointment only. Monday - Thursday 8am-5pm, Friday 8am-12pm Services: Cleanings, fillings, extractions. Payment Options: PAYMENT IS DUE AT THE TIME OF SERVICE. Cash, Visa or MasterCard. Sliding scale - $30 minimum per service. Best way to get seen: Come in to office, complete packet and make an appointment - need proof of income or support monies for each household member and proof  of [facility] residence. Usually takes about a month to get in.     [facility] [phone]   Location: [address], [place] Clinic Hours: Walk-in Urgent Care Dental Services are offered Monday-Friday mornings only. The numbers of emergencies accepted daily is limited to the number of providers available. Maximum 15 - Mondays, Wednesdays & Thursdays Maximum 10 - Tuesdays & Fridays Services: You do not need to be a [facility] resident to be seen for a dental emergency. Emergencies are defined as pain, swelling, abnormal bleeding, or dental trauma. Walkins will receive x-rays if needed. NOTE: Dental cleaning is not an emergency. Payment Options: PAYMENT IS DUE AT THE TIME OF SERVICE. Minimum co-pay is $40.00 for uninsured patients. Minimum co-pay is $3.00 for Medicaid with dental coverage. Dental Insurance is accepted and must be presented at time of visit. Medicare does not cover dental. Forms of payment: Cash, credit card, checks. Best way to get seen: If not previously registered with the clinic, walk-in dental registration begins at 7:15 am and is on a first come/first serve basis. If previously registered with the clinic, call to make an appointment.     [facility] [place] ONLY   Location: [address] [place], [place] Clinic Hours: Mon-Thu 10a-2p Services: Extractions only! Payment Options: FREE (donations accepted) - bring proof of income or support Best way to get seen: Call and schedule an appointment OR come at 8am on the 1st Monday of every month (except for holidays) when it is first come/first served.     [facility] [phone]   Location: [place], [place] Clinic Hours: Friday mornings Services, Payment Options, Best way to get seen: Call for info

## 2018-03-12 NOTE — ED Triage Notes (Signed)
Patient c/o dental pain after eating a peppermint today and breaking a tooth. Also reports her left ear hurts and down her jaw where the broken tooth is

## 2018-03-12 NOTE — ED Provider Notes (Signed)
[facility] Emergency Department Provider Note  ____________________________________________   First MD Initiated Contact with Patient 03/12/18 1319     (approximate)  I have reviewed the triage vital signs and the nursing notes.   HISTORY  Chief Complaint Dental Pain and Otalgia (left)   HPI Ariana Cook is a 58 y.o. female to the ED with complaint of left lower dental pain.  Patient states that she was eating a piece of peppermint candy when she bit down and broke her tooth off.  Patient does not have a dentist and states she is in a great deal of pain.  She rates her pain as a 10/10.  History reviewed. No pertinent past medical history.  There are no active problems to display for this patient.   Past Surgical History:  Procedure Laterality Date  . ABDOMINAL HYSTERECTOMY    . FRACTURE SURGERY      Prior to Admission medications   Medication Sig Start Date End Date Taking? Authorizing Provider  amoxicillin (AMOXIL) 500 MG capsule Take 1 capsule (500 mg total) by mouth 3 (three) times daily. 03/12/18   Tommi Rumps, PA-C  HYDROcodone-acetaminophen (NORCO/VICODIN) 5-325 MG tablet Take 1 tablet by mouth every 6 (six) hours as needed for moderate pain. 03/12/18   Tommi Rumps, PA-C  ibuprofen (ADVIL,MOTRIN) 600 MG tablet Take 1 tablet (600 mg total) by mouth every 8 (eight) hours as needed. 03/12/18   Tommi Rumps, PA-C    Allergies Patient has no known allergies.  History reviewed. No pertinent family history.  Social History Social History   Tobacco Use  . Smoking status: Former Smoker    Types: Cigarettes  . Smokeless tobacco: Never Used  Substance Use Topics  . Alcohol use: No  . Drug use: No    Review of Systems Constitutional: No fever/chills Eyes: No visual changes. ENT: No sore throat.  Positive dental pain. Cardiovascular: Denies chest pain. Respiratory: Denies shortness of breath. Neurological: Negative for  headaches, focal weakness or numbness. ___________________________________________   PHYSICAL EXAM:  VITAL SIGNS: ED Triage Vitals  Enc Vitals Group     BP 03/12/18 1235 (!) 174/105     Pulse Rate 03/12/18 1233 96     Resp 03/12/18 1233 16     Temp 03/12/18 1233 98.3 F (36.8 C)     Temp Source 03/12/18 1233 Oral     SpO2 03/12/18 1233 100 %     Weight 03/12/18 1233 161 lb (73 kg)     Height 03/12/18 1233 5\' 2"  (1.575 m)     Head Circumference --      Peak Flow --      Pain Score 03/12/18 1233 10     Pain Loc --      Pain Edu? --      Excl. in GC? --    Constitutional: Alert and oriented. Well appearing and in no acute distress. Eyes: Conjunctivae are normal.  Head: Atraumatic. Nose: No congestion/rhinnorhea. Mouth/Throat: Mucous membranes are moist.  Oropharynx non-erythematous.  Left lower molar appears to have had a cavity however half of her tooth has been sheared off and dentin is exposed.  There is no bleeding present.  Area is tender to touch.  Tooth avulsion does appear to be fresh. Neck: No stridor.   Cardiovascular: Normal rate, regular rhythm. Grossly normal heart sounds.  Good peripheral circulation. Respiratory: Normal respiratory effort.  No retractions. Lungs CTAB. Neurologic:  Normal speech and language. No gross focal  neurologic deficits are appreciated. No gait instability. Skin:  Skin is warm, dry and intact Psychiatric: Mood and affect are normal. Speech and behavior are normal.  ____________________________________________   LABS (all labs ordered are listed, but only abnormal results are displayed)  Labs Reviewed - No data to display  PROCEDURES  Procedure(s) performed: None  Procedures  Critical Care performed: No  ____________________________________________   INITIAL IMPRESSION / ASSESSMENT AND PLAN / ED COURSE  As part of my medical decision making, I reviewed the following data within the electronic MEDICAL RECORD NUMBER Notes from prior  ED visits and [place] Controlled Substance Database  Patient presents to the ED with complaint of dental pain.  She states she was eating a piece of peppermint last evening when she broke off a tooth in the back.  Patient does not have a dentist.  She has taken over-the-counter medication without any relief.  There is a fracture of the left lower molar that appears to be fresh.  Patient was given a list of dental clinics in the area including [facility].  She was given a prescription for amoxicillin 500 mg 3 times daily for 10 days, Norco 1 every 6 hours as needed #10 and a prescription for ibuprofen.  She is encouraged to call or use the walk-in clinic at [facility].  ____________________________________________   FINAL CLINICAL IMPRESSION(S) / ED DIAGNOSES  Final diagnoses:  Dental injury, initial encounter     ED Discharge Orders         Ordered    amoxicillin (AMOXIL) 500 MG capsule  3 times daily     03/12/18 1454    HYDROcodone-acetaminophen (NORCO/VICODIN) 5-325 MG tablet  Every 6 hours PRN     03/12/18 1456    ibuprofen (ADVIL,MOTRIN) 600 MG tablet  Every 8 hours PRN,   Status:  Discontinued     03/12/18 1456    ibuprofen (ADVIL,MOTRIN) 600 MG tablet  Every 8 hours PRN     03/12/18 1459           Note:  This document was prepared using Dragon voice recognition software and may include unintentional dictation errors.    Tommi Rumps, PA-C 03/12/18 1506    Sharman Cheek, MD 03/19/18 0001

## 2018-03-12 NOTE — ED Notes (Signed)
Reference triage note. Pt states pain in left ear began after breaking tooth. Pt also c/o pain with swallowing. Pt states pain is 10/10.

## 2018-12-02 ENCOUNTER — Emergency Department
Admission: EM | Admit: 2018-12-02 | Discharge: 2018-12-02 | Disposition: A | Discharge status: Home / Self Care | Payer: Medicaid Other | Attending: Emergency Medicine | Admitting: Emergency Medicine

## 2018-12-02 ENCOUNTER — Encounter: Payer: Self-pay | Admitting: Emergency Medicine

## 2018-12-02 ENCOUNTER — Emergency Department: Payer: Medicaid Other

## 2018-12-02 ENCOUNTER — Other Ambulatory Visit: Payer: Self-pay

## 2018-12-02 DIAGNOSIS — Z87891 Personal history of nicotine dependence: Secondary | ICD-10-CM | POA: Insufficient documentation

## 2018-12-02 DIAGNOSIS — Z79899 Other long term (current) drug therapy: Secondary | ICD-10-CM | POA: Insufficient documentation

## 2018-12-02 DIAGNOSIS — R2241 Localized swelling, mass and lump, right lower limb: Secondary | ICD-10-CM | POA: Insufficient documentation

## 2018-12-02 DIAGNOSIS — M79604 Pain in right leg: Secondary | ICD-10-CM

## 2018-12-02 MED ORDER — TRAMADOL HCL 50 MG PO TABS
50.0000 mg | ORAL_TABLET | Freq: Once | ORAL | Status: AC
  Administered 2018-12-02: 50 mg via ORAL
  Filled 2018-12-02: qty 1

## 2018-12-02 MED ORDER — TRAMADOL HCL 50 MG PO TABS: 50.0000 mg | ORAL_TABLET | Freq: Four times a day (QID) | ORAL | 0 refills | Status: AC | PRN

## 2018-12-02 NOTE — Discharge Instructions (Signed)
Please wear compression stocking on right lower extremity.  Avoid excessive ambulation to prevent increase in swelling in right leg.  Continue with Tylenol and ibuprofen as needed.  You may use tramadol as needed for severe pain.  Return to the emergency department for any warmth, redness increasing pain or any urgent changes in health.

## 2018-12-02 NOTE — ED Provider Notes (Signed)
[facility] EMERGENCY DEPARTMENT Provider Note   CSN: [id number] Arrival date & time: 12/02/18  0620     History   Chief Complaint Chief Complaint  Patient presents with  . Foot Pain    HPI Ariana Cook is a 58 y.o. female presents to the emergency department for evaluation of right foot and ankle swelling.  She also has noticed increased pain along her right tib-fib region where she underwent ORIF for a second time of her right tibia back in June 2020.  Patient states 2 days ago she was doing well ambulating with no assistance but over the last 2 days she has been having to hold onto things because of increased pain along the right tib-fib region.  Also having increased pain and swelling in the foot and ankle.  No trauma to the foot and ankle.  She has been taking Tylenol for pain.  She is ambulating now with a cane.  No history of blood clots, currently not on any blood thinners.  Pain is moderate and increased with standing and weightbearing.     HPI  History reviewed. No pertinent past medical history.  There are no active problems to display for this patient.   Past Surgical History:  Procedure Laterality Date  . ABDOMINAL HYSTERECTOMY    . APPENDECTOMY    . CHOLECYSTECTOMY    . FRACTURE SURGERY    . TUBAL LIGATION       OB History   No obstetric history on file.      Home Medications    Prior to Admission medications   Medication Sig Start Date End Date Taking? Authorizing Provider  ibuprofen (ADVIL,MOTRIN) 600 MG tablet Take 1 tablet (600 mg total) by mouth every 8 (eight) hours as needed. 03/12/18  Yes Tommi Rumps, PA-C  traMADol (ULTRAM) 50 MG tablet Take 1 tablet (50 mg total) by mouth every 6 (six) hours as needed. 12/02/18 12/02/19  Evon Slack, PA-C    Family History History reviewed. No pertinent family history.  Social History Social History   Tobacco Use  . Smoking status: Former Smoker    Types: Cigarettes   . Smokeless tobacco: Never Used  Substance Use Topics  . Alcohol use: No  . Drug use: No     Allergies   Patient has no known allergies.   Review of Systems Review of Systems  Constitutional: Negative for fever.  Respiratory: Negative for shortness of breath.   Cardiovascular: Negative for chest pain.  Gastrointestinal: Negative for abdominal pain.  Genitourinary: Negative for difficulty urinating, dysuria and urgency.  Musculoskeletal: Positive for gait problem and joint swelling. Negative for back pain and myalgias.  Skin: Negative for rash and wound.  Neurological: Negative for dizziness, numbness and headaches.     Physical Exam Updated Vital Signs BP (!) 146/82 (BP Location: Right Arm)   Pulse 78   Temp 98.6 F (37 C) (Oral)   Resp 16   Ht 5\' 2"  (1.575 m)   Wt 94.3 kg   SpO2 96%   BMI 38.04 kg/m   Physical Exam Constitutional:      Appearance: She is well-developed.  HENT:     Head: Normocephalic and atraumatic.  Eyes:     Conjunctiva/sclera: Conjunctivae normal.  Neck:     Musculoskeletal: Normal range of motion.  Cardiovascular:     Rate and Rhythm: Normal rate.  Pulmonary:     Effort: Pulmonary effort is normal. No respiratory distress.  Musculoskeletal: Normal  range of motion.     Comments: Right lower extremity shows patient has normal active range of motion.  She has mild swelling throughout the right tib-fib region as well as the ankle and foot.  Slight tenderness to palpation along the calf.  She has 2+ dorsalis pedis pulse with normal sensation throughout the right lower extremity.  No skin breakdown noted.  No warmth redness or edema.  Skin:    General: Skin is warm.     Findings: No rash.  Neurological:     Mental Status: She is alert and oriented to person, place, and time.  Psychiatric:        Behavior: Behavior normal.        Thought Content: Thought content normal.      ED Treatments / Results  Labs (all labs ordered are listed,  but only abnormal results are displayed) Labs Reviewed - No data to display  EKG None  Radiology Dg Tibia/fibula Right  Result Date: 12/02/2018 CLINICAL DATA:  Acute right lower leg pain and swelling. EXAM: RIGHT TIBIA AND FIBULA - 2 VIEW COMPARISON:  Radiographs of September 14, 2017. FINDINGS: Status post intramedullary rod fixation of old unhealed proximal right tibial shaft fracture. Old unhealed proximal right fibular shaft fracture is noted as well. No acute fracture or dislocation is noted. IMPRESSION: Old posttraumatic and postsurgical changes are noted. No definite acute abnormality is noted. Electronically Signed   By: Lupita RaiderJames  Green Jr M.D.   On: 12/02/2018 08:29   [place] Venous Img Lower Unilateral Right  Result Date: 12/02/2018 CLINICAL DATA:  Right lower extremity pain and edema. EXAM: RIGHT LOWER EXTREMITY VENOUS DOPPLER ULTRASOUND TECHNIQUE: Gray-scale sonography with graded compression, as well as color Doppler and duplex ultrasound were performed to evaluate the lower extremity deep venous systems from the level of the common femoral vein and including the common femoral, femoral, profunda femoral, popliteal and calf veins including the posterior tibial, peroneal and gastrocnemius veins when visible. The superficial great saphenous vein was also interrogated. Spectral Doppler was utilized to evaluate flow at rest and with distal augmentation maneuvers in the common femoral, femoral and popliteal veins. COMPARISON:  None. FINDINGS: Contralateral Common Femoral Vein: Respiratory phasicity is normal and symmetric with the symptomatic side. No evidence of thrombus. Normal compressibility. Common Femoral Vein: No evidence of thrombus. Normal compressibility, respiratory phasicity and response to augmentation. Saphenofemoral Junction: No evidence of thrombus. Normal compressibility and flow on color Doppler imaging. Profunda Femoral Vein: No evidence of thrombus. Normal compressibility and flow on  color Doppler imaging. Femoral Vein: No evidence of thrombus. Normal compressibility, respiratory phasicity and response to augmentation. Popliteal Vein: No evidence of thrombus. Normal compressibility, respiratory phasicity and response to augmentation. Calf Veins: No evidence of thrombus. Normal compressibility and flow on color Doppler imaging. Superficial Great Saphenous Vein: No evidence of thrombus. Normal compressibility. Venous Reflux:  None. Other Findings: No evidence of superficial thrombophlebitis or abnormal fluid collection. IMPRESSION: No evidence of right lower extremity deep venous thrombosis. Electronically Signed   By: Irish LackGlenn  Yamagata M.D.   On: 12/02/2018 08:02    Procedures Procedures (including critical care time)  Medications Ordered in ED Medications  traMADol (ULTRAM) tablet 50 mg (has no administration in time range)     Initial Impression / Assessment and Plan / ED Course  I have reviewed the triage vital signs and the nursing notes.  Pertinent labs & imaging results that were available during my care of the patient were reviewed by me and  considered in my medical decision making (see chart for details).        58 year old female underwent intramedullary fixation of right tibia back in June 2020.  Had been doing well up until yesterday and this morning he had increased pain and swelling in the foot and ankle little bit in the calf as well.  No warmth or redness.  No signs of any infection.  Vital signs are stable.  Ultrasound of lower extremities showed no blood clot.  X-rays of the tib-fib region showed no hardware failure or acute fracture.  Patient advised to wear compression stocking, keep the lower extremity elevated.  She is given tramadol to take for severe pain as needed.  She will follow-up with her orthopedist.  She understands signs symptoms return to ED for.  Final Clinical Impressions(s) / ED Diagnoses   Final diagnoses:  Right leg pain  Localized  swelling of right foot    ED Discharge Orders         Ordered    traMADol (ULTRAM) 50 MG tablet  Every 6 hours PRN     12/02/18 0844           Duanne Guess, PA-C 12/02/18 0846    Vanessa Poynette, MD 12/02/18 [phone]

## 2018-12-02 NOTE — ED Triage Notes (Addendum)
Pt presents with swollen right foot x 2 days; says she was "straight walking" before but now she has to use a cane; denies injury; pt says her right leg is now hurting too; pt says "it's a sharp, dull pain"

## 2018-12-09 ENCOUNTER — Other Ambulatory Visit: Payer: Self-pay

## 2018-12-09 ENCOUNTER — Encounter: Payer: Self-pay | Admitting: Emergency Medicine

## 2018-12-09 ENCOUNTER — Emergency Department: Payer: Self-pay

## 2018-12-09 ENCOUNTER — Emergency Department
Admission: EM | Admit: 2018-12-09 | Discharge: 2018-12-09 | Disposition: A | Discharge status: Home / Self Care | Payer: Self-pay | Attending: Emergency Medicine | Admitting: Emergency Medicine

## 2018-12-09 DIAGNOSIS — Z79899 Other long term (current) drug therapy: Secondary | ICD-10-CM | POA: Insufficient documentation

## 2018-12-09 DIAGNOSIS — Y929 Unspecified place or not applicable: Secondary | ICD-10-CM | POA: Insufficient documentation

## 2018-12-09 DIAGNOSIS — X58XXXA Exposure to other specified factors, initial encounter: Secondary | ICD-10-CM | POA: Insufficient documentation

## 2018-12-09 DIAGNOSIS — Y939 Activity, unspecified: Secondary | ICD-10-CM | POA: Insufficient documentation

## 2018-12-09 DIAGNOSIS — Z87891 Personal history of nicotine dependence: Secondary | ICD-10-CM | POA: Insufficient documentation

## 2018-12-09 DIAGNOSIS — Z7982 Long term (current) use of aspirin: Secondary | ICD-10-CM | POA: Insufficient documentation

## 2018-12-09 DIAGNOSIS — S82101A Unspecified fracture of upper end of right tibia, initial encounter for closed fracture: Secondary | ICD-10-CM | POA: Insufficient documentation

## 2018-12-09 DIAGNOSIS — R2241 Localized swelling, mass and lump, right lower limb: Secondary | ICD-10-CM | POA: Insufficient documentation

## 2018-12-09 DIAGNOSIS — Y998 Other external cause status: Secondary | ICD-10-CM | POA: Insufficient documentation

## 2018-12-09 DIAGNOSIS — R509 Fever, unspecified: Secondary | ICD-10-CM | POA: Insufficient documentation

## 2018-12-09 LAB — CBC WITH DIFFERENTIAL/PLATELET
Abs Immature Granulocytes: 0.02 10*3/uL (ref 0.00–0.07)
Basophils Absolute: 0.1 10*3/uL (ref 0.0–0.1)
Basophils Relative: 1 %
Eosinophils Absolute: 0.3 10*3/uL (ref 0.0–0.5)
Eosinophils Relative: 4 %
HCT: 38.7 % (ref 36.0–46.0)
Hemoglobin: 12.5 g/dL (ref 12.0–15.0)
Immature Granulocytes: 0 %
Lymphocytes Relative: 27 %
Lymphs Abs: 2 10*3/uL (ref 0.7–4.0)
MCH: 28.8 pg (ref 26.0–34.0)
MCHC: 32.3 g/dL (ref 30.0–36.0)
MCV: 89.2 fL (ref 80.0–100.0)
Monocytes Absolute: 0.8 10*3/uL (ref 0.1–1.0)
Monocytes Relative: 10 %
Neutro Abs: 4.3 10*3/uL (ref 1.7–7.7)
Neutrophils Relative %: 58 %
Platelets: 285 10*3/uL (ref 150–400)
RBC: 4.34 MIL/uL (ref 3.87–5.11)
RDW: 15 % (ref 11.5–15.5)
WBC: 7.5 10*3/uL (ref 4.0–10.5)
nRBC: 0 % (ref 0.0–0.2)

## 2018-12-09 LAB — COMPREHENSIVE METABOLIC PANEL
ALT: 40 U/L (ref 0–44)
AST: 34 U/L (ref 15–41)
Albumin: 4.1 g/dL (ref 3.5–5.0)
Alkaline Phosphatase: 93 U/L (ref 38–126)
Anion gap: 9 (ref 5–15)
BUN: 22 mg/dL — ABNORMAL HIGH (ref 6–20)
CO2: 22 mmol/L (ref 22–32)
Calcium: 9.2 mg/dL (ref 8.9–10.3)
Chloride: 106 mmol/L (ref 98–111)
Creatinine, Ser: 0.86 mg/dL (ref 0.44–1.00)
GFR calc Af Amer: >60 mL/min (ref >60)
GFR calc non Af Amer: >60 mL/min (ref >60)
Glucose, Bld: 111 mg/dL — ABNORMAL HIGH (ref 70–99)
Potassium: 3.9 mmol/L (ref 3.5–5.1)
Sodium: 137 mmol/L (ref 135–145)
Total Bilirubin: 0.9 mg/dL (ref 0.3–1.2)
Total Protein: 8.1 g/dL (ref 6.5–8.1)

## 2018-12-09 LAB — URIC ACID: Uric Acid, Serum: 5.2 mg/dL (ref 2.5–7.1)

## 2018-12-09 MED ORDER — SODIUM CHLORIDE 0.9 % IV SOLN
1.0000 g | Freq: Once | INTRAVENOUS | Status: AC
  Administered 2018-12-09: 11:00:00 1 g via INTRAVENOUS
  Filled 2018-12-09: qty 10

## 2018-12-09 MED ORDER — HYDROCODONE-ACETAMINOPHEN 5-325 MG PO TABS: 1.0000 | ORAL_TABLET | Freq: Four times a day (QID) | ORAL | 0 refills | Status: DC | PRN

## 2018-12-09 MED ORDER — CEPHALEXIN 500 MG PO CAPS: 500.0000 mg | ORAL_CAPSULE | Freq: Four times a day (QID) | ORAL | 0 refills | Status: AC

## 2018-12-09 MED ORDER — MORPHINE SULFATE (PF) 4 MG/ML IV SOLN
4.0000 mg | Freq: Once | INTRAVENOUS | Status: AC
  Administered 2018-12-09: 4 mg via INTRAVENOUS
  Filled 2018-12-09: qty 1

## 2018-12-09 MED ORDER — IOHEXOL 300 MG/ML  SOLN
100.0000 mL | Freq: Once | INTRAMUSCULAR | Status: AC | PRN
  Administered 2018-12-09: 100 mL via INTRAVENOUS
  Filled 2018-12-09: qty 100

## 2018-12-09 MED ORDER — ONDANSETRON HCL 4 MG/2ML IJ SOLN
4.0000 mg | Freq: Once | INTRAMUSCULAR | Status: AC
Start: 2018-12-09 — End: 2018-12-09
  Administered 2018-12-09: 4 mg via INTRAVENOUS
  Filled 2018-12-09: qty 2

## 2018-12-09 NOTE — ED Notes (Signed)
Pt reports pain and weakness in her right knee x1 week. Pt was seen last Sunday for same.

## 2018-12-09 NOTE — Discharge Instructions (Signed)
Follow-up with your surgeon.  Please call for an appointment.  We have concerns that there is additional infection due to the CT.  There also is a fracture at the proximal tibia.  Unsure if this is new or old but due to the fact he cannot bear weight on your leg I would recommend being splinted and you be on crutches until evaluated by your surgeon.

## 2018-12-09 NOTE — ED Triage Notes (Addendum)
Pt to ED via POV c/o right leg and foot pain. Pt seen 1 week ago for same, had negative U/S. Pt had surgery in June 2020 in right leg. No signs of infection present on arrival. Pt states that she is unable to stand, however, when she arrived in the ED she was walking with the assistance of a cane. Pt reports that she was given Tramadol for pain last week and it is not helping with the pain. Pt states "that's how I know something is wrong because I have taken tramadol before and it help. I know there is infection or something wrong, I don't care what anyone says". Pt is in NAD at this time.

## 2018-12-09 NOTE — ED Provider Notes (Signed)
[facility] Emergency Department Provider Note  ____________________________________________   First MD Initiated Contact with Patient 12/09/18 [phone]     (approximate)  I have reviewed the triage vital signs and the nursing notes.   HISTORY  Chief Complaint Leg Pain    HPI Ariana Cook is a 58 y.o. female presents emergency department complaining of continued right leg pain.  States she was seen here last week.  History of hardware in the right lower leg which had to be removed due to infection.  She states that the pain is typical to that which she had when she had an infection.  States she did have some fever and chills last night.  States she was soaking wet when she woke up this morning.  Still unable to bear weight on the right lower extremity.  Is complaining of increased swelling also.    History reviewed. No pertinent past medical history.  There are no active problems to display for this patient.   Past Surgical History:  Procedure Laterality Date  . ABDOMINAL HYSTERECTOMY    . APPENDECTOMY    . CHOLECYSTECTOMY    . FRACTURE SURGERY    . TUBAL LIGATION      Prior to Admission medications   Medication Sig Start Date End Date Taking? Authorizing Provider  aspirin 325 MG tablet Take 325 mg by mouth 2 (two) times daily. 08/14/18 08/14/19 Yes [provider]  gabapentin (NEURONTIN) 300 MG capsule Take 300 mg by mouth 3 (three) times daily. 11/20/18 12/20/18 Yes [provider]  naproxen (NAPROSYN) 500 MG tablet Take 500 mg by mouth 2 (two) times daily with a meal. 11/20/18 01/19/19 Yes [provider]  cephALEXin (KEFLEX) 500 MG capsule Take 1 capsule (500 mg total) by mouth 4 (four) times daily for 10 days. 12/09/18 12/19/18  Fisher, Roselyn Bering, PA-C  HYDROcodone-acetaminophen (NORCO/VICODIN) 5-325 MG tablet Take 1 tablet by mouth every 6 (six) hours as needed for moderate pain. 12/09/18   Fisher, Roselyn Bering, PA-C  ibuprofen  (ADVIL,MOTRIN) 600 MG tablet Take 1 tablet (600 mg total) by mouth every 8 (eight) hours as needed. 03/12/18   Tommi Rumps, PA-C  traMADol (ULTRAM) 50 MG tablet Take 1 tablet (50 mg total) by mouth every 6 (six) hours as needed. 12/02/18 12/02/19  Evon Slack, PA-C    Allergies Patient has no known allergies.  No family history on file.  Social History Social History   Tobacco Use  . Smoking status: Former Smoker    Types: Cigarettes  . Smokeless tobacco: Never Used  Substance Use Topics  . Alcohol use: No  . Drug use: No    Review of Systems  Constitutional: No fever/chills Eyes: No visual changes. ENT: No sore throat. Respiratory: Denies cough Genitourinary: Negative for dysuria. Musculoskeletal: Negative for back pain.  Positive for right leg pain and questionable infection at hardware Skin: Negative for rash.    ____________________________________________   PHYSICAL EXAM:  VITAL SIGNS: ED Triage Vitals  Enc Vitals Group     BP 12/09/18 0804 (!) 160/86     Pulse Rate 12/09/18 0804 99     Resp 12/09/18 0804 16     Temp 12/09/18 0804 97.8 F (36.6 C)     Temp Source 12/09/18 0804 Oral     SpO2 12/09/18 0804 97 %     Weight 12/09/18 0805 198 lb (89.8 kg)     Height 12/09/18 0805 5\' 2"  (1.575 m)  Head Circumference --      Peak Flow --      Pain Score 12/09/18 0805 10     Pain Loc --      Pain Edu? --      Excl. in GC? --     Constitutional: Alert and oriented. Well appearing and in no acute distress. Eyes: Conjunctivae are normal.  Head: Atraumatic. Nose: No congestion/rhinnorhea. Mouth/Throat: Mucous membranes are moist.   Neck:  supple no lymphadenopathy noted Cardiovascular: Normal rate, regular rhythm. Heart sounds are normal Respiratory: Normal respiratory effort.  No retractions, lungs c t a  GU: deferred Musculoskeletal: FROM all extremities, warm and well perfused, patient is unable to bear weight without use of a cane, right upper  thigh is tender, right lower leg is tender, more swelling is noted at the joint line of the right knee, right calf is tender, neurovascular appears to be intact, no cellulitis is noted Neurologic:  Normal speech and language.  Skin:  Skin is warm, dry and intact. No rash noted. Psychiatric: Mood and affect are normal. Speech and behavior are normal.  ____________________________________________   LABS (all labs ordered are listed, but only abnormal results are displayed)  Labs Reviewed  COMPREHENSIVE METABOLIC PANEL - Abnormal; Notable for the following components:      Result Value   Glucose, Bld 111 (*)    BUN 22 (*)    All other components within normal limits  CBC WITH DIFFERENTIAL/PLATELET  URIC ACID   ____________________________________________   ____________________________________________  RADIOLOGY  X-ray of the femur, tib-fib, and foot are all stable.  No new fractures noted CT of the right tib-fib shows stranding which could indicate infection and a tibial fracture  ____________________________________________   PROCEDURES  Procedure(s) performed: Saline lock, morphine 4 mg IV, Zofran 4 mg IV   Procedures    ____________________________________________   INITIAL IMPRESSION / ASSESSMENT AND PLAN / ED COURSE  Pertinent labs & imaging results that were available during my care of the patient were reviewed by me and considered in my medical decision making (see chart for details).   Patient is 58 year old female who is returning to the ED after 1 week of continued right leg pain.  See HPI  Physical exam shows the right leg to be tender at the right upper thigh, right knee, right calf, and right foot  X-ray of the right femur, tib-fib, and foot were ordered If negative will order ultrasound  At this time I do not think that the patient is septic.  Her temperature is normal and vitals are normal.  If however there appears to be an elevated white count will  add blood cultures and a lactic acid.  Patient was given morphine 4 mg IV and Zofran 4 mg IV.  CBC is normal, comprehensive metabolic panel is basically normal, uric acid is normal  Explained the findings to the patient.  She states she is just really concerned that she cannot bear weight on the leg.  She states of not asking for pain medicine and wants to know why cannot bear weight on lower leg.  Discussed case with Dr. Marisa SeverinSiadecki.  He states we could do a CT of the tib-fib to ensure there is not a stress fracture/infection.  CT of the right tib-fib with IV contrast.  CT showed some stranding which could indicate additional infection along with a tibial fracture.  Unsure whether this is a new fracture as the radiologist did not state.  Therefore she will  be treated empirically for infection and a new fracture.  She was given Rocephin 1 g IV.  She was discharged with a prescription for Keflex 500 4 times daily.  She was placed in a long-leg OCL and given crutches.  She is to follow-up with her surgeon at [facility].  Phone number was attached to her discharge papers.  She states she understands all instructions and will comply.    Ariana Cook was evaluated in Emergency Department on 12/09/2018 for the symptoms described in the history of present illness. She was evaluated in the context of the global COVID-19 pandemic, which necessitated consideration that the patient might be at risk for infection with the SARS-CoV-2 virus that causes COVID-19. Institutional protocols and algorithms that pertain to the evaluation of patients at risk for COVID-19 are in a state of rapid change based on information released by regulatory bodies including the CDC and federal and state organizations. These policies and algorithms were followed during the patient's care in the ED.   As part of my medical decision making, I reviewed the following data within the [facility] notes reviewed and  incorporated, Old chart reviewed, Radiograph reviewed x-rays are negative, CT shows a tibial fracture along with questionable infection, Notes from prior ED visits and [place] Controlled Substance Database  ____________________________________________   FINAL CLINICAL IMPRESSION(S) / ED DIAGNOSES  Final diagnoses:  Closed fracture of proximal end of right tibia, unspecified fracture morphology, initial encounter      NEW MEDICATIONS STARTED DURING THIS VISIT:  Discharge Medication List as of 12/09/2018 11:16 AM    START taking these medications   Details  cephALEXin (KEFLEX) 500 MG capsule Take 1 capsule (500 mg total) by mouth 4 (four) times daily for 10 days., Starting Sun 12/09/2018, Until Wed 12/19/2018, Normal    HYDROcodone-acetaminophen (NORCO/VICODIN) 5-325 MG tablet Take 1 tablet by mouth every 6 (six) hours as needed for moderate pain., Starting Sun 12/09/2018, Normal         Note:  This document was prepared using Dragon voice recognition software and may include unintentional dictation errors.    Versie Starks, PA-C 12/09/18 1548    Arta Silence, MD 12/09/18 1606

## 2019-12-12 ENCOUNTER — Encounter: Payer: Self-pay | Admitting: Emergency Medicine

## 2019-12-12 ENCOUNTER — Emergency Department
Admission: EM | Admit: 2019-12-12 | Discharge: 2019-12-12 | Disposition: A | Discharge status: Home / Self Care | Payer: Medicaid Other | Attending: Emergency Medicine | Admitting: Emergency Medicine

## 2019-12-12 ENCOUNTER — Other Ambulatory Visit: Payer: Self-pay

## 2019-12-12 ENCOUNTER — Emergency Department: Payer: Medicaid Other

## 2019-12-12 DIAGNOSIS — Z87891 Personal history of nicotine dependence: Secondary | ICD-10-CM | POA: Insufficient documentation

## 2019-12-12 DIAGNOSIS — M5136 Other intervertebral disc degeneration, lumbar region: Secondary | ICD-10-CM | POA: Insufficient documentation

## 2019-12-12 DIAGNOSIS — M5416 Radiculopathy, lumbar region: Secondary | ICD-10-CM | POA: Insufficient documentation

## 2019-12-12 MED ORDER — CYCLOBENZAPRINE HCL 10 MG PO TABS: 10.0000 mg | ORAL_TABLET | Freq: Three times a day (TID) | ORAL | 0 refills | Status: DC | PRN

## 2019-12-12 MED ORDER — KETOROLAC TROMETHAMINE 30 MG/ML IJ SOLN
30.0000 mg | Freq: Once | INTRAMUSCULAR | Status: AC
  Administered 2019-12-12: 30 mg via INTRAMUSCULAR
  Filled 2019-12-12: qty 1

## 2019-12-12 MED ORDER — CYCLOBENZAPRINE HCL 10 MG PO TABS
10.0000 mg | ORAL_TABLET | Freq: Once | ORAL | Status: AC
  Administered 2019-12-12: 10 mg via ORAL
  Filled 2019-12-12: qty 1

## 2019-12-12 MED ORDER — PREDNISONE 10 MG (21) PO TBPK: ORAL_TABLET | ORAL | 0 refills | Status: DC

## 2019-12-12 MED ORDER — HYDROCODONE-ACETAMINOPHEN 5-325 MG PO TABS
1.0000 | ORAL_TABLET | Freq: Once | ORAL | Status: AC
  Administered 2019-12-12: 1 via ORAL
  Filled 2019-12-12: qty 1
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
## 2019-12-12 NOTE — Discharge Instructions (Addendum)
Follow-up with your regular doctor as needed.  Follow-up with orthopedics at [facility].  Take medication as prescribed.  Return if worsening.

## 2020-05-20 ENCOUNTER — Emergency Department: Payer: Self-pay

## 2020-05-20 ENCOUNTER — Emergency Department
Admission: EM | Admit: 2020-05-20 | Discharge: 2020-05-20 | Disposition: A | Discharge status: Home / Self Care | Payer: Self-pay | Attending: Emergency Medicine | Admitting: Emergency Medicine

## 2020-05-20 ENCOUNTER — Other Ambulatory Visit: Payer: Self-pay

## 2020-05-20 DIAGNOSIS — R1032 Left lower quadrant pain: Secondary | ICD-10-CM | POA: Insufficient documentation

## 2020-05-20 DIAGNOSIS — B349 Viral infection, unspecified: Secondary | ICD-10-CM | POA: Insufficient documentation

## 2020-05-20 DIAGNOSIS — J189 Pneumonia, unspecified organism: Secondary | ICD-10-CM

## 2020-05-20 DIAGNOSIS — Z7982 Long term (current) use of aspirin: Secondary | ICD-10-CM | POA: Insufficient documentation

## 2020-05-20 DIAGNOSIS — Z87891 Personal history of nicotine dependence: Secondary | ICD-10-CM | POA: Insufficient documentation

## 2020-05-20 DIAGNOSIS — R430 Anosmia: Secondary | ICD-10-CM | POA: Insufficient documentation

## 2020-05-20 DIAGNOSIS — J181 Lobar pneumonia, unspecified organism: Secondary | ICD-10-CM | POA: Insufficient documentation

## 2020-05-20 DIAGNOSIS — Z20822 Contact with and (suspected) exposure to covid-19: Secondary | ICD-10-CM | POA: Insufficient documentation

## 2020-05-20 LAB — COMPREHENSIVE METABOLIC PANEL
ALT: 52 U/L — ABNORMAL HIGH (ref 0–44)
AST: 49 U/L — ABNORMAL HIGH (ref 15–41)
Albumin: 4.3 g/dL (ref 3.5–5.0)
Alkaline Phosphatase: 78 U/L (ref 38–126)
Anion gap: 7 (ref 5–15)
BUN: 26 mg/dL — ABNORMAL HIGH (ref 6–20)
CO2: 19 mmol/L — ABNORMAL LOW (ref 22–32)
Calcium: 8.9 mg/dL (ref 8.9–10.3)
Chloride: 109 mmol/L (ref 98–111)
Creatinine, Ser: 1.07 mg/dL — ABNORMAL HIGH (ref 0.44–1.00)
GFR, Estimated: >60 mL/min (ref >60)
Glucose, Bld: 91 mg/dL (ref 70–99)
Potassium: 3.5 mmol/L (ref 3.5–5.1)
Sodium: 135 mmol/L (ref 135–145)
Total Bilirubin: 0.6 mg/dL (ref 0.3–1.2)
Total Protein: 8.6 g/dL — ABNORMAL HIGH (ref 6.5–8.1)

## 2020-05-20 LAB — URINALYSIS, COMPLETE (UACMP) WITH MICROSCOPIC
Bacteria, UA: NONE SEEN
Bilirubin Urine: NEGATIVE
Glucose, UA: NEGATIVE mg/dL
Hgb urine dipstick: NEGATIVE
Ketones, ur: 5 mg/dL — AB
Leukocytes,Ua: NEGATIVE
Nitrite: NEGATIVE
Protein, ur: 100 mg/dL — AB
Specific Gravity, Urine: 1.036 — ABNORMAL HIGH (ref 1.005–1.030)
pH: 5 (ref 5.0–8.0)

## 2020-05-20 LAB — CBC WITH DIFFERENTIAL/PLATELET
Abs Immature Granulocytes: 0.05 10*3/uL (ref 0.00–0.07)
Basophils Absolute: 0 10*3/uL (ref 0.0–0.1)
Basophils Relative: 0 %
Eosinophils Absolute: 0 10*3/uL (ref 0.0–0.5)
Eosinophils Relative: 0 %
HCT: 40.6 % (ref 36.0–46.0)
Hemoglobin: 13.9 g/dL (ref 12.0–15.0)
Immature Granulocytes: 1 %
Lymphocytes Relative: 35 %
Lymphs Abs: 2 10*3/uL (ref 0.7–4.0)
MCH: 32 pg (ref 26.0–34.0)
MCHC: 34.2 g/dL (ref 30.0–36.0)
MCV: 93.5 fL (ref 80.0–100.0)
Monocytes Absolute: 0.6 10*3/uL (ref 0.1–1.0)
Monocytes Relative: 11 %
Neutro Abs: 3 10*3/uL (ref 1.7–7.7)
Neutrophils Relative %: 53 %
Platelets: 203 10*3/uL (ref 150–400)
RBC: 4.34 MIL/uL (ref 3.87–5.11)
RDW: 13 % (ref 11.5–15.5)
WBC: 5.7 10*3/uL (ref 4.0–10.5)
nRBC: 0 % (ref 0.0–0.2)

## 2020-05-20 LAB — POC URINE PREG, ED: Preg Test, Ur: NEGATIVE

## 2020-05-20 LAB — RESP PANEL BY RT-PCR (FLU A&B, COVID) ARPGX2
Influenza A by PCR: NEGATIVE
Influenza B by PCR: NEGATIVE
SARS Coronavirus 2 by RT PCR: NEGATIVE

## 2020-05-20 LAB — TROPONIN I (HIGH SENSITIVITY)
Troponin I (High Sensitivity): 6 ng/L (ref <18)
Troponin I (High Sensitivity): 6 ng/L (ref <18)

## 2020-05-20 LAB — LIPASE, BLOOD: Lipase: 47 U/L (ref 11–51)

## 2020-05-20 MED ORDER — MORPHINE SULFATE (PF) 2 MG/ML IV SOLN
2.0000 mg | Freq: Once | INTRAVENOUS | Status: AC
  Administered 2020-05-20: 2 mg via INTRAVENOUS
  Filled 2020-05-20: qty 1

## 2020-05-20 MED ORDER — SODIUM CHLORIDE 0.9 % IV BOLUS
1000.0000 mL | Freq: Once | INTRAVENOUS | Status: AC
  Administered 2020-05-20: 1000 mL via INTRAVENOUS

## 2020-05-20 MED ORDER — IOHEXOL 350 MG/ML SOLN
100.0000 mL | Freq: Once | INTRAVENOUS | Status: AC | PRN
  Administered 2020-05-20: 100 mL via INTRAVENOUS
  Filled 2020-05-20: qty 100

## 2020-05-20 MED ORDER — PREDNISONE 20 MG PO TABS
60.0000 mg | ORAL_TABLET | Freq: Once | ORAL | Status: AC
  Administered 2020-05-20: 60 mg via ORAL
  Filled 2020-05-20: qty 3

## 2020-05-20 MED ORDER — ONDANSETRON HCL 4 MG/2ML IJ SOLN
4.0000 mg | Freq: Once | INTRAMUSCULAR | Status: AC
  Administered 2020-05-20: 4 mg via INTRAVENOUS
  Filled 2020-05-20: qty 2

## 2020-05-20 MED ORDER — AZITHROMYCIN 250 MG PO TABS: ORAL_TABLET | ORAL | 0 refills | Status: AC

## 2020-05-20 MED ORDER — PREDNISONE 10 MG PO TABS: ORAL_TABLET | ORAL | 0 refills | Status: AC

## 2020-05-20 MED ORDER — AMOXICILLIN 500 MG PO CAPS
500.0000 mg | ORAL_CAPSULE | Freq: Once | ORAL | Status: AC
  Administered 2020-05-20: 500 mg via ORAL
  Filled 2020-05-20: qty 1

## 2020-05-20 MED ORDER — AMOXICILLIN 500 MG PO CAPS: 500.0000 mg | ORAL_CAPSULE | Freq: Three times a day (TID) | ORAL | 0 refills | Status: AC

## 2020-05-20 MED ORDER — AZITHROMYCIN 500 MG PO TABS
500.0000 mg | ORAL_TABLET | Freq: Once | ORAL | Status: AC
  Administered 2020-05-20: 500 mg via ORAL
  Filled 2020-05-20: qty 1

## 2020-05-20 NOTE — ED Notes (Addendum)
Patient reports inability to obtain a ride home at this time. This RN attempted to get the patient a taxi ride, but was unsuccessful. Patient was encouraged to contact family and friends for a ride. Patient provided a pillow and blanket, and discharged to the lobby awaiting transport home. Patient educated about the courtesy phone in the triage lobby for her use as needed. Patient is alert, oriented x4, with even and unlabored respirations. Patient provided discharge instructions by Trula Ore, RN.

## 2020-05-20 NOTE — ED Triage Notes (Signed)
Pt comes with c/o flu like symptoms. Pt states this started few days ago. Pt comes via E[facility] .VSS  Pt states cough, fever and body aches.

## 2020-05-20 NOTE — ED Notes (Signed)
Portable chest xray at bedside.

## 2020-05-20 NOTE — ED Notes (Signed)
Patient transported to CT 

## 2020-05-20 NOTE — ED Notes (Signed)
Patient reports cough, fever, chills and lack of appetite x 5 days. Patient reports she works at [employer], and is concerned about the flu or C19. Patient denies SOB, or chest pain, but reports some nausea r/t lack of appetite. Patient is alert, oriented x4, and ambulatory to bed from wheelchair.

## 2020-05-20 NOTE — ED Notes (Signed)
Patient ambulatory with steady gait. Respirations even and unlabored.

## 2020-05-20 NOTE — Discharge Instructions (Addendum)
Take antibiotics and prednisone as prescribed.  [facility] findings of the lymph node that needs to be followed in approximately 3 months.  Return to the emergency department with any worsening.

## 2020-05-20 NOTE — ED Provider Notes (Signed)
[facility] Provider Note  ____________________________________________   Event Date/Time   First MD Initiated Contact with Patient 05/20/20 1856     (approximate)  I have reviewed the triage vital signs and the nursing notes.   HISTORY  Chief Complaint Cough and Fever  HPI Ariana Cook is a 60 y.o. female who reports to the emergency department for evaluation of illness over the last 6 days.  On Thursday of last week, patient contracted illness that began with nausea, vomiting and diarrhea that was sustained for 24 hours.  After this, she lost appetite and has had minimal oral intake over the last 6 days.  She endorses this is associated with cough, shortness of breath, chest pain that radiates to the back as well as left lower quadrant pain.  She also reports loss of taste and smell.  She works as a Advertising copywriterhousekeeper at [employer], and noted that she had to frequently sit down between test today due to progressing shortness of breath.  She reports that she feels like she has been hot at home, however states that she has not taken her temperature, does endorse chills and sweats.  She denies any persisting vomiting or diarrhea, but does state that she has persisting nausea.  Denies dysuria, frequency.  Does not know of any known sick contacts, however is concerned about flu/Covid.  When asked what bothers her the most, she reports is the shortness of breath as well as left lower quadrant pain.  No alleviating measures have been attempted.       History reviewed. No pertinent past medical history.  There are no problems to display for this patient.   Past Surgical History:  Procedure Laterality Date  . ABDOMINAL HYSTERECTOMY    . APPENDECTOMY    . CHOLECYSTECTOMY    . FRACTURE SURGERY    . TUBAL LIGATION      Prior to Admission medications   Medication Sig Start Date End Date Taking? Authorizing Provider  amoxicillin (AMOXIL) 500 MG  capsule Take 1 capsule (500 mg total) by mouth 3 (three) times daily for 7 days. 05/20/20 05/27/20 Yes Lucy Chrisodgers, Aella Ronda J, PA  aspirin 325 MG tablet Take by mouth. 08/14/18  Yes [provider]  azithromycin (ZITHROMAX Z-PAK) 250 MG tablet Take 2 tablets (500 mg) on  Day 1,  followed by 1 tablet (250 mg) once daily on Days 2 through 5. 05/20/20 05/25/20 Yes Cylie Dor, Ruben Gottronaitlin J, PA  gabapentin (NEURONTIN) 300 MG capsule Take by mouth. 11/20/18 04/30/21 Yes [provider]  predniSONE (DELTASONE) 10 MG tablet Take 6 tablets (60 mg total) by mouth daily for 1 day, THEN 5 tablets (50 mg total) daily for 1 day, THEN 4 tablets (40 mg total) daily for 1 day, THEN 3 tablets (30 mg total) daily for 1 day, THEN 2 tablets (20 mg total) daily for 1 day, THEN 1 tablet (10 mg total) daily for 1 day. 05/20/20 05/26/20 Yes Tristina Sahagian, Ruben Gottronaitlin J, PA  cyclobenzaprine (FLEXERIL) 10 MG tablet Take 1 tablet (10 mg total) by mouth 3 (three) times daily as needed. 12/12/19   Faythe GheeFisher, Susan W, PA-C    Allergies Patient has no known allergies.  No family history on file.  Social History Social History   Tobacco Use  . Smoking status: Former Smoker    Types: Cigarettes  . Smokeless tobacco: Never Used  Vaping Use  . Vaping Use: Never used  Substance Use Topics  . Alcohol use: No  .  Drug use: No    Review of Systems Constitutional: + fever/chills Eyes: No visual changes. ENT: No sore throat. Cardiovascular: + chest pain. Respiratory: + Cough,+ shortness of breath. Gastrointestinal: + LLQ abdominal pain.  +nausea, + vomiting.  + diarrhea.  No constipation. Genitourinary: Negative for dysuria. Musculoskeletal: Negative for back pain. Skin: Negative for rash. Neurological: + headaches, negative for focal weakness or numbness.  ____________________________________________   PHYSICAL EXAM:  VITAL SIGNS: ED Triage Vitals  Enc Vitals Group     BP 05/20/20 1826 (!) 145/88     Pulse Rate 05/20/20 1826  90     Resp 05/20/20 1826 18     Temp 05/20/20 1826 98.1 F (36.7 C)     Temp Source 05/20/20 1826 Oral     SpO2 05/20/20 1826 99 %     Weight --      Height --      Head Circumference --      Peak Flow --      Pain Score 05/20/20 1822 4     Pain Loc --      Pain Edu? --      Excl. in GC? --    Constitutional: Alert and oriented. Well appearing and in no acute distress. Eyes: Conjunctivae are normal. PERRL. EOMI. Head: Atraumatic. Nose: Mild congestion/rhinnorhea. Mouth/Throat: Mucous membranes are moist.  Oropharynx erythematous without tonsillar enlargement or exudate. Neck: No stridor.   Lymphatic: There is bilateral anterior cervical lymphadenopathy Cardiovascular: Normal rate, regular rhythm. Grossly normal heart sounds.  Good peripheral circulation. Respiratory: Normal respiratory effort.  No retractions. Lungs with coarse breath sounds throughout without any appreciated rhonchi, crackles or wheezing. Gastrointestinal: Soft without distention.  There is tenderness to palpation of the left lower quadrant without guarding. No abdominal bruits. No CVA tenderness. Musculoskeletal: No lower extremity tenderness nor edema.  No joint effusions. Neurologic:  Normal speech and language. No gross focal neurologic deficits are appreciated. No gait instability. Skin:  Skin is warm, dry and intact. No rash noted. Psychiatric: Mood and affect are normal. Speech and behavior are normal.  ____________________________________________   LABS (all labs ordered are listed, but only abnormal results are displayed)  Labs Reviewed  COMPREHENSIVE METABOLIC PANEL - Abnormal; Notable for the following components:      Result Value   CO2 19 (*)    BUN 26 (*)    Creatinine, Ser 1.07 (*)    Total Protein 8.6 (*)    AST 49 (*)    ALT 52 (*)    All other components within normal limits  URINALYSIS, COMPLETE (UACMP) WITH MICROSCOPIC - Abnormal; Notable for the following components:   Color, Urine  YELLOW (*)    APPearance HAZY (*)    Specific Gravity, Urine 1.036 (*)    Ketones, ur 5 (*)    Protein, ur 100 (*)    All other components within normal limits  RESP PANEL BY RT-PCR (FLU A&B, COVID) ARPGX2  CBC WITH DIFFERENTIAL/PLATELET  LIPASE, BLOOD  POC URINE PREG, ED  TROPONIN I (HIGH SENSITIVITY)  TROPONIN I (HIGH SENSITIVITY)   ____________________________________________  EKG  Normal sinus rhythm with a rate of 78 bpm, no ST elevations or depressions, no T wave inversions.  No evidence of acute ischemia. ____________________________________________  RADIOLOGY I, Lucy Chris, personally viewed and evaluated these images (plain radiographs) as part of my medical decision making, as well as reviewing the written report by the radiologist.  ED provider interpretation: Chest x-ray with increased basilar markings  concerning for pneumonia versus atelectasis.  There is what appears to be clavicle fracture that appears stable  Official radiology report(s): CT Angio Chest PE W and/or Wo Contrast  Result Date: 05/20/2020 CLINICAL DATA:  Abdominal pain and fever and suspected pulmonary embolism in a 60 year old female. EXAM: CT ANGIOGRAPHY CHEST CT ABDOMEN AND PELVIS WITH CONTRAST TECHNIQUE: Multidetector CT imaging of the chest was performed using the standard protocol during bolus administration of intravenous contrast. Multiplanar CT image reconstructions and MIPs were obtained to evaluate the vascular anatomy. Multidetector CT imaging of the abdomen and pelvis was performed using the standard protocol during bolus administration of intravenous contrast. CONTRAST:  OMNIPAQUE IOHEXOL 350 MG/ML SOLN COMPARISON:  January 04, 2011. FINDINGS: CTA CHEST FINDINGS Cardiovascular: Calcified and noncalcified atheromatous plaque in the thoracic aorta., changes are mild. No aneurysmal dilation of the thoracic aorta. Heart size top normal without pericardial effusion. No sign of pulmonary  embolism. Central pulmonary vasculature is normal caliber. Mediastinum/Nodes: Mildly patulous esophagus. 12 mm subcarinal lymph node. No hilar adenopathy, no thoracic inlet lymphadenopathy. No axillary lymphadenopathy. Lungs/Pleura: No sign of consolidation. No pleural effusion. Airways are patent. Nodular density on the chest portion of the exam in the RIGHT costodiaphragmatic sulcus, this is in the RIGHT middle lobe and appears less nodular on the abdominal portion of the examination, more geographic. Area of lingular scarring. Musculoskeletal: No acute findings related to the bony structures of the thorax. Post ORIF of the LEFT clavicle. Remote posttraumatic changes of LEFT-sided ribs posteriorly. Signs of healed rib fractures. Review of the MIP images confirms the above findings. CT ABDOMEN and PELVIS FINDINGS Hepatobiliary: Marked hepatic steatosis. Post cholecystectomy. No biliary duct distension. Pancreas: Normal, without mass, inflammation or ductal dilatation. Spleen: Spleen normal size and contour. Adrenals/Urinary Tract: Adrenal glands are normal. Symmetric renal enhancement. No suspicious renal lesion. No hydronephrosis. No perinephric stranding. Urinary bladder under distended limiting assessment. Stomach/Bowel: Stomach unremarkable. Small bowel nondilated. Sigmoid diverticular disease. Appendix not visible but there are no secondary signs of acute appendicitis. No signs of pericolonic stranding. Under distension of distal colon. Vascular/Lymphatic: Calcified atheromatous plaque of the abdominal aorta. There is no gastrohepatic or hepatoduodenal ligament lymphadenopathy. No retroperitoneal or mesenteric lymphadenopathy. No pelvic sidewall lymphadenopathy. Reproductive: Post hysterectomy. Other: No ascites. Musculoskeletal: No acute musculoskeletal process. Spinal degenerative changes. Posttraumatic changes about the LEFT pubic bone, healed fractures in this area related to prior trauma. Review of the  MIP images confirms the above findings. IMPRESSION: 1. Negative for pulmonary embolism. 2. Nodular density on the chest portion of the exam in the RIGHT middle lobe appears less nodular on the abdominal portion of the examination. This is favored to represent an area of scarring or pneumonitis. 3. 12 mm subcarinal lymph node, nonspecific, potentially reactive; however, given size would suggest a 3 month follow-up at which time attention can be directed to pulmonary findings which again are likely related to scarring or pneumonitis. 4. Marked hepatic steatosis. 5. Post cholecystectomy and hysterectomy. 6. Sigmoid diverticular disease. 7. Signs of prior trauma to the LEFT hemithorax and LEFT pelvis. Aortic Atherosclerosis (ICD10-I70.0). Electronically Signed   By: Donzetta Kohut M.D.   On: 05/20/2020 22:10   CT ABDOMEN PELVIS W CONTRAST  Result Date: 05/20/2020 CLINICAL DATA:  Abdominal pain and fever and suspected pulmonary embolism in a 60 year old female. EXAM: CT ANGIOGRAPHY CHEST CT ABDOMEN AND PELVIS WITH CONTRAST TECHNIQUE: Multidetector CT imaging of the chest was performed using the standard protocol during bolus administration of intravenous contrast. Multiplanar CT  image reconstructions and MIPs were obtained to evaluate the vascular anatomy. Multidetector CT imaging of the abdomen and pelvis was performed using the standard protocol during bolus administration of intravenous contrast. CONTRAST:  OMNIPAQUE IOHEXOL 350 MG/ML SOLN COMPARISON:  January 04, 2011. FINDINGS: CTA CHEST FINDINGS Cardiovascular: Calcified and noncalcified atheromatous plaque in the thoracic aorta., changes are mild. No aneurysmal dilation of the thoracic aorta. Heart size top normal without pericardial effusion. No sign of pulmonary embolism. Central pulmonary vasculature is normal caliber. Mediastinum/Nodes: Mildly patulous esophagus. 12 mm subcarinal lymph node. No hilar adenopathy, no thoracic inlet lymphadenopathy. No  axillary lymphadenopathy. Lungs/Pleura: No sign of consolidation. No pleural effusion. Airways are patent. Nodular density on the chest portion of the exam in the RIGHT costodiaphragmatic sulcus, this is in the RIGHT middle lobe and appears less nodular on the abdominal portion of the examination, more geographic. Area of lingular scarring. Musculoskeletal: No acute findings related to the bony structures of the thorax. Post ORIF of the LEFT clavicle. Remote posttraumatic changes of LEFT-sided ribs posteriorly. Signs of healed rib fractures. Review of the MIP images confirms the above findings. CT ABDOMEN and PELVIS FINDINGS Hepatobiliary: Marked hepatic steatosis. Post cholecystectomy. No biliary duct distension. Pancreas: Normal, without mass, inflammation or ductal dilatation. Spleen: Spleen normal size and contour. Adrenals/Urinary Tract: Adrenal glands are normal. Symmetric renal enhancement. No suspicious renal lesion. No hydronephrosis. No perinephric stranding. Urinary bladder under distended limiting assessment. Stomach/Bowel: Stomach unremarkable. Small bowel nondilated. Sigmoid diverticular disease. Appendix not visible but there are no secondary signs of acute appendicitis. No signs of pericolonic stranding. Under distension of distal colon. Vascular/Lymphatic: Calcified atheromatous plaque of the abdominal aorta. There is no gastrohepatic or hepatoduodenal ligament lymphadenopathy. No retroperitoneal or mesenteric lymphadenopathy. No pelvic sidewall lymphadenopathy. Reproductive: Post hysterectomy. Other: No ascites. Musculoskeletal: No acute musculoskeletal process. Spinal degenerative changes. Posttraumatic changes about the LEFT pubic bone, healed fractures in this area related to prior trauma. Review of the MIP images confirms the above findings. IMPRESSION: 1. Negative for pulmonary embolism. 2. Nodular density on the chest portion of the exam in the RIGHT middle lobe appears less nodular on the  abdominal portion of the examination. This is favored to represent an area of scarring or pneumonitis. 3. 12 mm subcarinal lymph node, nonspecific, potentially reactive; however, given size would suggest a 3 month follow-up at which time attention can be directed to pulmonary findings which again are likely related to scarring or pneumonitis. 4. Marked hepatic steatosis. 5. Post cholecystectomy and hysterectomy. 6. Sigmoid diverticular disease. 7. Signs of prior trauma to the LEFT hemithorax and LEFT pelvis. Aortic Atherosclerosis (ICD10-I70.0). Electronically Signed   By: Donzetta Kohut M.D.   On: 05/20/2020 22:10   DG Chest Portable 1 View  Result Date: 05/20/2020 CLINICAL DATA:  Chest pain short of breath EXAM: PORTABLE CHEST 1 VIEW COMPARISON:  09/27/2017 FINDINGS: Surgical plate and screw fixation left clavicle. Progressive healing of left scapular fracture. Multiple old left rib fractures. No pleural effusion. Streaky basilar opacities. Normal heart size. No pneumothorax. Aortic atherosclerosis IMPRESSION: 1. Streaky basilar opacities, atelectasis versus minimal pneumonia. 2. Remote left scapular fracture with progressed healing. Electronically Signed   By: Jasmine Pang M.D.   On: 05/20/2020 19:42    ____________________________________________   INITIAL IMPRESSION / ASSESSMENT AND PLAN / ED COURSE  As part of my medical decision making, I reviewed the following data within the electronic MEDICAL RECORD NUMBER Nursing notes reviewed and incorporated, Labs reviewed, EKG interpreted, Radiograph reviewed and  Notes from prior ED visits        Patient is a 60 year old female who reports to the emergency department for evaluation of 1 week of illness associated with fever, chest pain with radiation to the back, left lower quadrant abdominal pain, nausea, vomiting and diarrhea that has since resolved.  See HPI for further details.  In triage, patient is mildly hypertensive but otherwise has normal vital  signs.  On physical exam, patient is noted to have anterior cervical lymphadenopathy, erythematous oropharynx, coarse breath sounds throughout as well as tenderness to palpation of left lower quadrant, otherwise remaining physical exam is grossly within normal limits.    Initial evaluation began with EKG, chest x-ray, urinalysis, CBC, CMP, troponin, lipase, respiratory panel.  EKG is grossly normal.  Chest x-ray does demonstrate some atelectasis versus early pneumonia in the bases with increased perihilar markings.  CBC is grossly within normal limits without any elevated white count.  CMP shows mild elevations in AST and ALT as well as mild elevation in creatinine at 1.07, otherwise unremarkable.  Troponin initial is negative at 6.  Initial lipase of 47 is normal.  Respiratory panel negative for Covid and flu.  Urinalysis does demonstrate proteinuria and ketonuria without bacteria, leukocytes or nitrites.  The patient did not meet PERC criteria for PE rule out secondary to her age, and still complains of persisting shortness of breath that seems out of proportion to x-ray findings.  Will continue work-up with CT angio of the chest to rule out PE as well as CT of the abdomen and pelvis to evaluate left lower quadrant pain.    CT negative for PE.  There is concern for 12mm enlarged lymph node recommending 56-month follow-up.  This is also possibly related to pneumonitis per CT report.  No acute infection identified in the CT of the abdomen and pelvis.  Given commendation of x-ray and CT findings, will initiate treatment for community-acquired pneumonia with amoxicillin, azithromycin and will prescribe the patient steroids for her shortness of breath related to this.  Advised primary care follow-up for possible repeat imaging in 3 months per recommendation from radiology.  Patient is amenable with this plan, return precautions were discussed.  She stable this time for outpatient follow-up.       ____________________________________________   FINAL CLINICAL IMPRESSION(S) / ED DIAGNOSES  Final diagnoses:  Community acquired pneumonia, unspecified laterality  Viral illness     ED Discharge Orders         Ordered    amoxicillin (AMOXIL) 500 MG capsule  3 times daily        05/20/20 2241    azithromycin (ZITHROMAX Z-PAK) 250 MG tablet        05/20/20 2241    predniSONE (DELTASONE) 10 MG tablet        05/20/20 2241          *Please note:  [facility]ana Cook was evaluated in Emergency Department on 05/20/2020 for the symptoms described in the history of present illness. She was evaluated in the context of the global COVID-19 pandemic, which necessitated consideration that the patient might be at risk for infection with the SARS-CoV-2 virus that causes COVID-19. Institutional protocols and algorithms that pertain to the evaluation of patients at risk for COVID-19 are in a state of rapid change based on information released by regulatory bodies including the CDC and federal and state organizations. These policies and algorithms were followed during the patient's care in the ED.  Some ED evaluations  and interventions may be delayed as a result of limited staffing during and the pandemic.*   Note:  This document was prepared using Dragon voice recognition software and may include unintentional dictation errors.   Lucy Chris, PA 05/20/20 2244    Concha Se, MD 05/22/20 [phone]

## 2020-06-16 ENCOUNTER — Emergency Department: Payer: Medicaid Other

## 2020-06-16 ENCOUNTER — Emergency Department
Admission: EM | Admit: 2020-06-16 | Discharge: 2020-06-16 | Disposition: A | Discharge status: Home / Self Care | Payer: Medicaid Other | Attending: Emergency Medicine | Admitting: Emergency Medicine

## 2020-06-16 ENCOUNTER — Other Ambulatory Visit: Payer: Self-pay

## 2020-06-16 ENCOUNTER — Encounter: Payer: Self-pay | Admitting: Emergency Medicine

## 2020-06-16 DIAGNOSIS — T40601A Poisoning by unspecified narcotics, accidental (unintentional), initial encounter: Secondary | ICD-10-CM | POA: Insufficient documentation

## 2020-06-16 DIAGNOSIS — R0789 Other chest pain: Secondary | ICD-10-CM | POA: Insufficient documentation

## 2020-06-16 DIAGNOSIS — Z87891 Personal history of nicotine dependence: Secondary | ICD-10-CM | POA: Insufficient documentation

## 2020-06-16 DIAGNOSIS — R112 Nausea with vomiting, unspecified: Secondary | ICD-10-CM | POA: Insufficient documentation

## 2020-06-16 DIAGNOSIS — Z7982 Long term (current) use of aspirin: Secondary | ICD-10-CM | POA: Insufficient documentation

## 2020-06-16 LAB — CBC WITH DIFFERENTIAL/PLATELET
Abs Immature Granulocytes: 0.05 10*3/uL (ref 0.00–0.07)
Basophils Absolute: 0 10*3/uL (ref 0.0–0.1)
Basophils Relative: 0 %
Eosinophils Absolute: 0.1 10*3/uL (ref 0.0–0.5)
Eosinophils Relative: 1 %
HCT: 41.7 % (ref 36.0–46.0)
Hemoglobin: 13.6 g/dL (ref 12.0–15.0)
Immature Granulocytes: 0 %
Lymphocytes Relative: 10 %
Lymphs Abs: 1.3 10*3/uL (ref 0.7–4.0)
MCH: 31.8 pg (ref 26.0–34.0)
MCHC: 32.6 g/dL (ref 30.0–36.0)
MCV: 97.4 fL (ref 80.0–100.0)
Monocytes Absolute: 0.8 10*3/uL (ref 0.1–1.0)
Monocytes Relative: 7 %
Neutro Abs: 10.3 10*3/uL — ABNORMAL HIGH (ref 1.7–7.7)
Neutrophils Relative %: 82 %
Platelets: 235 10*3/uL (ref 150–400)
RBC: 4.28 MIL/uL (ref 3.87–5.11)
RDW: 14.1 % (ref 11.5–15.5)
WBC: 12.6 10*3/uL — ABNORMAL HIGH (ref 4.0–10.5)
nRBC: 0 % (ref 0.0–0.2)

## 2020-06-16 LAB — BASIC METABOLIC PANEL
Anion gap: 8 (ref 5–15)
BUN: 23 mg/dL — ABNORMAL HIGH (ref 6–20)
CO2: 24 mmol/L (ref 22–32)
Calcium: 8.7 mg/dL — ABNORMAL LOW (ref 8.9–10.3)
Chloride: 106 mmol/L (ref 98–111)
Creatinine, Ser: 1.16 mg/dL — ABNORMAL HIGH (ref 0.44–1.00)
GFR, Estimated: 55 mL/min — ABNORMAL LOW (ref >60)
Glucose, Bld: 146 mg/dL — ABNORMAL HIGH (ref 70–99)
Potassium: 4.2 mmol/L (ref 3.5–5.1)
Sodium: 138 mmol/L (ref 135–145)

## 2020-06-16 LAB — TROPONIN I (HIGH SENSITIVITY): Troponin I (High Sensitivity): 8 ng/L (ref <18)

## 2020-06-16 MED ORDER — LACTATED RINGERS IV BOLUS
500.0000 mL | Freq: Once | INTRAVENOUS | Status: AC
  Administered 2020-06-16: 500 mL via INTRAVENOUS

## 2020-06-16 MED ORDER — ONDANSETRON HCL 4 MG/2ML IJ SOLN
4.0000 mg | Freq: Once | INTRAMUSCULAR | Status: AC
  Administered 2020-06-16: 4 mg via INTRAVENOUS
  Filled 2020-06-16: qty 2

## 2020-06-16 NOTE — ED Triage Notes (Addendum)
Pt via EMS from the [facility]. Per EMS, pt took an unknown "white powder" pt did not know what it was. Pt snorted it became unresponsive. Bystander gave and unknown amount of Narcan IM. Pt is A&Ox4 and NAD at this time. Pt c/o nausea and vomiting and chest pressure.   Dr. Larinda Buttery MD at bedside during triage. MSE was not signed.

## 2020-06-16 NOTE — ED Notes (Signed)
O2 saturation decreased to 87% on RA. Pt placed on 2L [place] at this time. O2 increased to 93%.

## 2020-06-16 NOTE — ED Provider Notes (Signed)
[facility] Emergency Department Provider Note   ____________________________________________   Event Date/Time   First MD Initiated Contact with Patient 06/16/20 1303     (approximate)  I have reviewed the triage vital signs and the nursing notes.   HISTORY  Chief Complaint Drug Overdose    HPI Ariana Cook is a 60 y.o. female with no significant past medical history who presents to the ED following overdose.  Patient reports that she was given an unknown "white powder" by a friend earlier today.  She remembers snorting it but does not remember what happened afterwards.  EMS states that she was found unresponsive and friend administered an unknown amount of IM Narcan.  Patient then became more responsive, was awake and alert at the time of EMS arrival.  She had some nausea and vomiting with EMS, additionally complains of pressure in her chest with some difficulty catching her breath here in the ED.  She states she was feeling fine prior to snorting the unknown drug.  She denies any intent to harm herself.        History reviewed. No pertinent past medical history.  There are no problems to display for this patient.   Past Surgical History:  Procedure Laterality Date  . ABDOMINAL HYSTERECTOMY    . APPENDECTOMY    . CHOLECYSTECTOMY    . FRACTURE SURGERY    . TUBAL LIGATION      Prior to Admission medications   Medication Sig Start Date End Date Taking? Authorizing Provider  aspirin 325 MG tablet Take by mouth. 08/14/18   [provider]  cyclobenzaprine (FLEXERIL) 10 MG tablet Take 1 tablet (10 mg total) by mouth 3 (three) times daily as needed. 12/12/19   Fisher, Roselyn Bering, PA-C  gabapentin (NEURONTIN) 300 MG capsule Take by mouth. 11/20/18 04/30/21  [provider]    Allergies Patient has no known allergies.  History reviewed. No pertinent family history.  Social History Social History   Tobacco Use  . Smoking status:  Former Smoker    Types: Cigarettes  . Smokeless tobacco: Never Used  Vaping Use  . Vaping Use: Never used  Substance Use Topics  . Alcohol use: No  . Drug use: No    Review of Systems  Constitutional: No fever/chills Eyes: No visual changes. ENT: No sore throat. Cardiovascular: Positive for chest pain. Respiratory: Positive for shortness of breath. Gastrointestinal: No abdominal pain.  Positive for nausea and vomiting.  No diarrhea.  No constipation. Genitourinary: Negative for dysuria. Musculoskeletal: Negative for back pain. Skin: Negative for rash. Neurological: Negative for headaches, focal weakness or numbness.  ____________________________________________   PHYSICAL EXAM:  VITAL SIGNS: ED Triage Vitals  Enc Vitals Group     BP      Pulse      Resp      Temp      Temp src      SpO2      Weight      Height      Head Circumference      Peak Flow      Pain Score      Pain Loc      Pain Edu?      Excl. in GC?     Constitutional: Alert and oriented. Eyes: Conjunctivae are normal. Head: Atraumatic. Nose: No congestion/rhinnorhea. Mouth/Throat: Mucous membranes are moist. Neck: Normal ROM Cardiovascular: Normal rate, regular rhythm. Grossly normal heart sounds.  2+ radial pulses bilaterally. Respiratory: Normal respiratory  effort.  No retractions. Lungs CTAB. Gastrointestinal: Soft and nontender. No distention. Genitourinary: deferred Musculoskeletal: No lower extremity tenderness nor edema. Neurologic:  Normal speech and language. No gross focal neurologic deficits are appreciated. Skin:  Skin is warm, dry and intact. No rash noted. Psychiatric: Mood and affect are normal. Speech and behavior are normal.  ____________________________________________   LABS (all labs ordered are listed, but only abnormal results are displayed)  Labs Reviewed  CBC WITH DIFFERENTIAL/PLATELET - Abnormal; Notable for the following components:      Result Value   WBC  12.6 (*)    Neutro Abs 10.3 (*)    All other components within normal limits  BASIC METABOLIC PANEL - Abnormal; Notable for the following components:   Glucose, Bld 146 (*)    BUN 23 (*)    Creatinine, Ser 1.16 (*)    Calcium 8.7 (*)    GFR, Estimated 55 (*)    All other components within normal limits  TROPONIN I (HIGH SENSITIVITY)   ____________________________________________  EKG  ED ECG REPORT I, Chesley Noon, the attending physician, personally viewed and interpreted this ECG.   Date: 06/16/2020  EKG Time: 13:14  Rate: 73  Rhythm: normal sinus rhythm  Axis: Normal  Intervals:none  ST&T Change: None   PROCEDURES  Procedure(s) performed (including Critical Care):  Procedures   ____________________________________________   INITIAL IMPRESSION / ASSESSMENT AND PLAN / ED COURSE       60 year old female with no significant past medical history presents to the ED following overdose on unknown "white powder."  She denies any intent to harm herself, now does complain of chest pressure, difficulty catching her breath, nausea, and vomiting.  Patient likely with opiate overdose and is not in any respiratory distress at this time, awake and alert and responding appropriately.  We will observe for any recurrent respiratory depression, screen EKG and troponin as well as chest x-ray for any evidence of aspiration.  Chest x-ray reviewed by me and shows no infiltrate, edema, or effusion.  Troponin is negative and patient states that both chest pain and difficulty breathing have resolved.  She had brief period of oxygen desaturation, but is now maintaining O2 sats on room air.  She has had no further respiratory depression and is appropriate for discharge home with PCP follow-up.  She was counseled to return to the ED for any new or worsening symptoms, patient agrees with plan.      ____________________________________________   FINAL CLINICAL IMPRESSION(S) / ED  DIAGNOSES  Final diagnoses:  Opiate overdose, accidental or unintentional, initial encounter [facility])     ED Discharge Orders    None       Note:  This document was prepared using Dragon voice recognition software and may include unintentional dictation errors.   Chesley Noon, MD 06/16/20 [phone]

## 2023-01-17 ENCOUNTER — Encounter: Payer: Self-pay | Admitting: Emergency Medicine

## 2023-01-17 ENCOUNTER — Emergency Department
Admission: EM | Admit: 2023-01-17 | Discharge: 2023-01-17 | Disposition: A | Discharge status: Home / Self Care | Payer: Medicaid Other | Attending: Emergency Medicine | Admitting: Emergency Medicine

## 2023-01-17 ENCOUNTER — Other Ambulatory Visit: Payer: Self-pay

## 2023-01-17 DIAGNOSIS — N3000 Acute cystitis without hematuria: Secondary | ICD-10-CM | POA: Diagnosis not present

## 2023-01-17 DIAGNOSIS — R300 Dysuria: Secondary | ICD-10-CM | POA: Diagnosis present

## 2023-01-17 LAB — URINALYSIS, ROUTINE W REFLEX MICROSCOPIC
Bilirubin Urine: NEGATIVE
Glucose, UA: NEGATIVE mg/dL
Hgb urine dipstick: NEGATIVE
Ketones, ur: NEGATIVE mg/dL
Nitrite: NEGATIVE
Protein, ur: 100 mg/dL — AB
Specific Gravity, Urine: 1.017 (ref 1.005–1.030)
WBC, UA: >50 WBC/hpf (ref 0–5)
pH: 6 (ref 5.0–8.0)

## 2023-01-17 MED ORDER — CEPHALEXIN 500 MG PO CAPS: 500.0000 mg | ORAL_CAPSULE | Freq: Three times a day (TID) | ORAL | 0 refills | Status: AC

## 2023-01-17 NOTE — ED Triage Notes (Signed)
Patient to ED via POV for difficulty voiding. Pt reports feeling like she is unable to fully void x2 weeks. Denies burning with urination.

## 2023-01-17 NOTE — ED Provider Notes (Signed)
[facility] Provider Note    Event Date/Time   First MD Initiated Contact with Patient 01/17/23 [phone]     (approximate)   History   Dysuria   HPI  Ariana Cook is a 62 y.o. female   presents to the ED with complaint of urinary symptoms for 2 weeks but denies burning with urination.  Patient reports that she has never had a urinary tract infection but has been uncomfortable.  She denies any fever, chills, nausea or vomiting.      Physical Exam   Triage Vital Signs: ED Triage Vitals [01/17/23 0854]  Encounter Vitals Group     BP (!) 162/78     Systolic BP Percentile      Diastolic BP Percentile      Pulse Rate 76     Resp 18     Temp (!) 97.5 F (36.4 C)     Temp Source Oral     SpO2 99 %     Weight 168 lb (76.2 kg)     Height 5\' 2"  (1.575 m)     Head Circumference      Peak Flow      Pain Score 8     Pain Loc      Pain Education      Exclude from Growth Chart     Most recent vital signs: Vitals:   01/17/23 0854  BP: (!) 162/78  Pulse: 76  Resp: 18  Temp: (!) 97.5 F (36.4 C)  SpO2: 99%     General: Awake, no distress.  CV:  Good peripheral perfusion.  Resp:  Normal effort.  Abd:  No distention.  Other:     ED Results / Procedures / Treatments   Labs (all labs ordered are listed, but only abnormal results are displayed) Labs Reviewed  URINALYSIS, ROUTINE W REFLEX MICROSCOPIC - Abnormal; Notable for the following components:      Result Value   Color, Urine YELLOW (*)    APPearance CLOUDY (*)    Protein, ur 100 (*)    Leukocytes,Ua LARGE (*)    Bacteria, UA FEW (*)    All other components within normal limits      PROCEDURES:  Critical Care performed:   Procedures   MEDICATIONS ORDERED IN ED: Medications - No data to display   IMPRESSION / MDM / ASSESSMENT AND PLAN / ED COURSE  I reviewed the triage vital signs and the nursing notes.   Differential diagnosis includes, but is not limited to,  urinary tract infection, kidney stone, obstruction, cystocele, prolapse.      Clinical Course as of 01/17/23 0950  Tue Jan 17, 2023  0932 Urinalysis, Routine w reflex microscopic -Urine, Clean Catch [BR]    Clinical Course User Index [BR] Hurley Cisco, Student-PA   Patient's presentation is most consistent with acute complicated illness / injury requiring diagnostic workup.  FINAL CLINICAL IMPRESSION(S) / ED DIAGNOSES   Final diagnoses:  Acute cystitis without hematuria     Rx / DC Orders   ED Discharge Orders          Ordered    cephALEXin (KEFLEX) 500 MG capsule  3 times daily        01/17/23 0949             Note:  This document was prepared using Dragon voice recognition software and may include unintentional dictation errors.   Tommi Rumps, PA-C 01/17/23 0950    Sharman Cheek,  MD 01/17/23 1444

## 2023-01-17 NOTE — ED Notes (Signed)
See triage note  Presents with lower back and and some diff with urination  States this started over the weekend Ambulates well to treatment room

## 2023-01-17 NOTE — Discharge Instructions (Signed)
Follow-up with your primary care or [place] urgent care if any continued problems.  Take all the medicine until completely finished.  Increase fluids to stay hydrated and also so that you are urinating frequently.

## 2023-02-28 IMAGING — CT CT ABD-PELV W/ CM
2 of 4 series · 14 of 46 positions shown, 16 images · IV contrast (APPLIED)
Comparison: January 04, 2011.

CLINICAL DATA: Abdominal pain and fever and suspected pulmonary
embolism in a 59-year-old female.

EXAM:
CT ANGIOGRAPHY CHEST
CT ABDOMEN AND PELVIS WITH CONTRAST
TECHNIQUE: Multidetector CT imaging of the chest was performed using the
standard protocol during bolus administration of intravenous
contrast. Multiplanar CT image reconstructions and MIPs were
obtained to evaluate the vascular anatomy. Multidetector CT imaging
of the abdomen and pelvis was performed using the standard protocol
during bolus administration of intravenous contrast.
CONTRAST:  100mL OMNIPAQUE IOHEXOL 350 MG/ML SOLN

[Series 3: axial st · axial · 0.92mm/px · z∈[-1081,-636]mm · 11 of 99 slices shown, 13 images]
[im 5/99  soft-tissue]
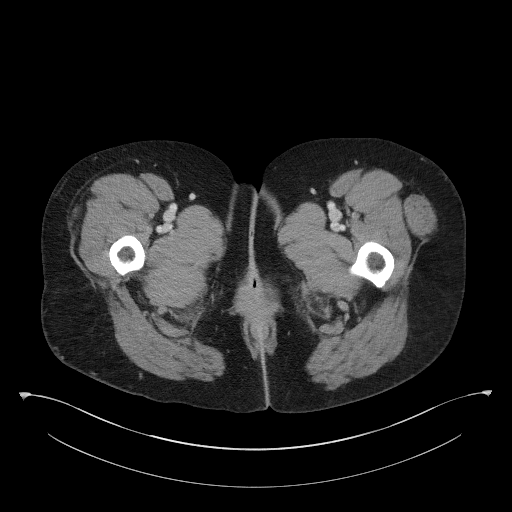
[im 5/99  bone]
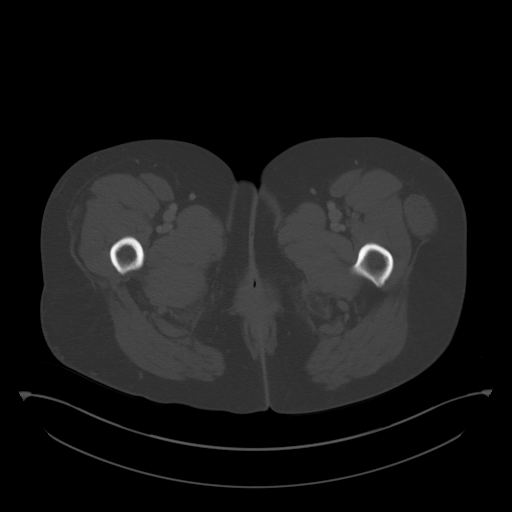
[im 15/99  soft-tissue]
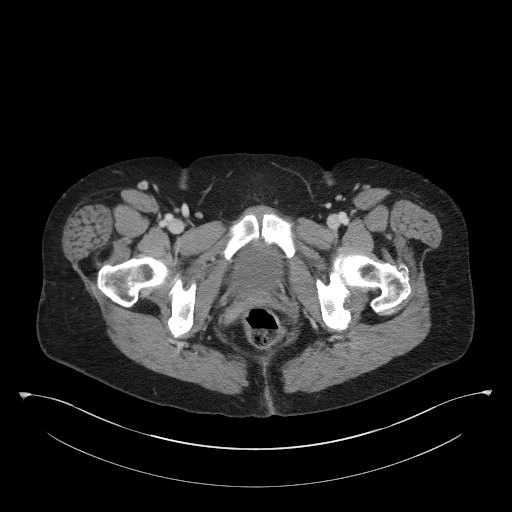
[im 24/99  soft-tissue]
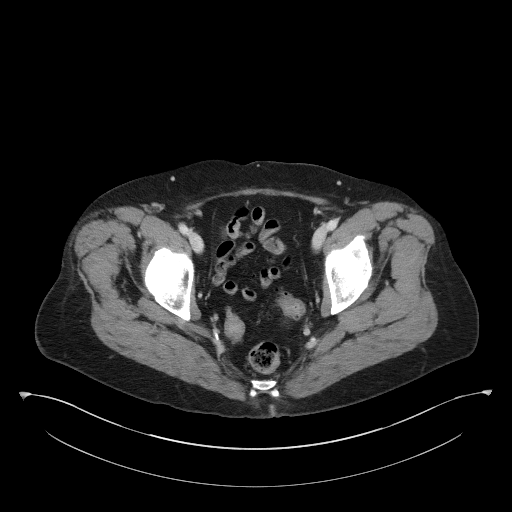
[im 33/99  soft-tissue]
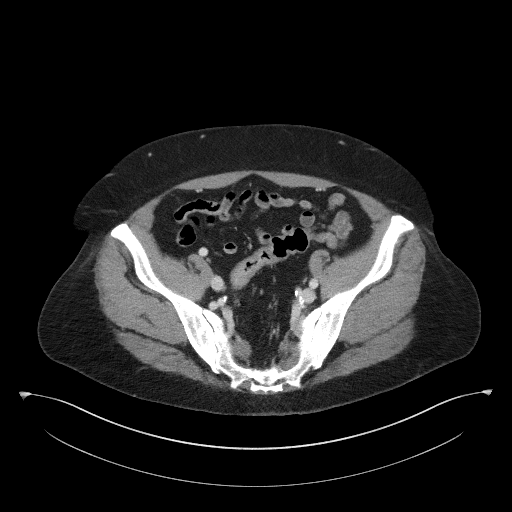
[im 43/99  soft-tissue]
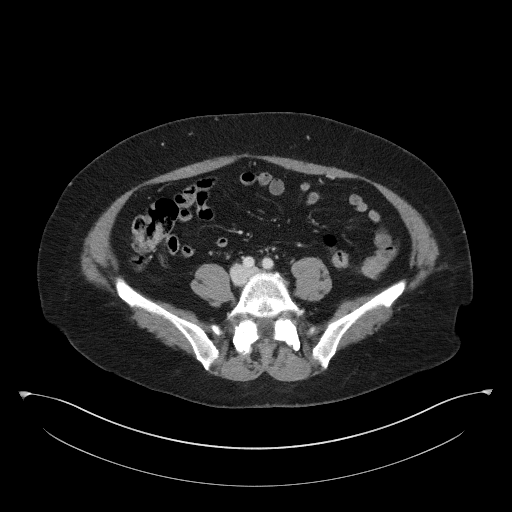
[im 52/99  soft-tissue]
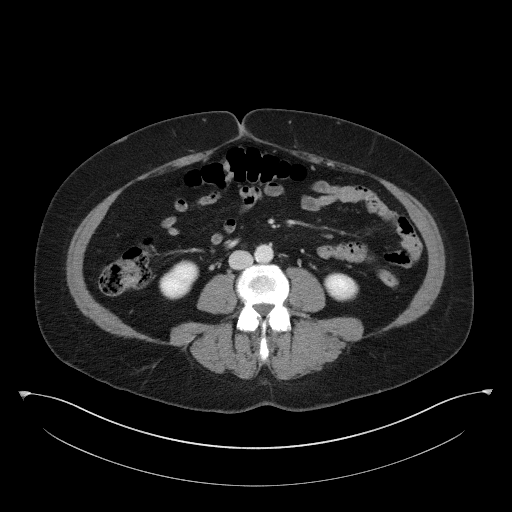
[im 57/99  soft-tissue]
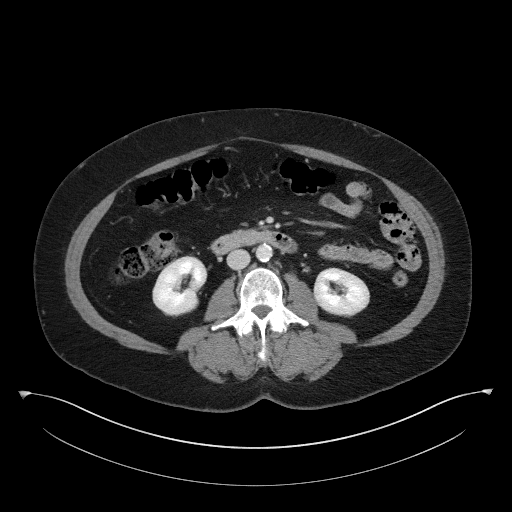
[im 66/99  soft-tissue]
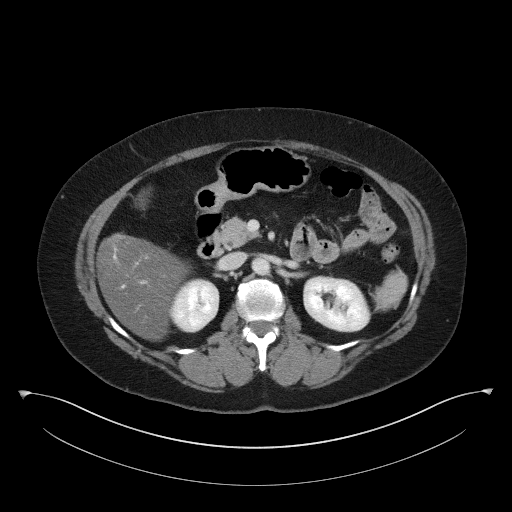
[im 75/99  soft-tissue]
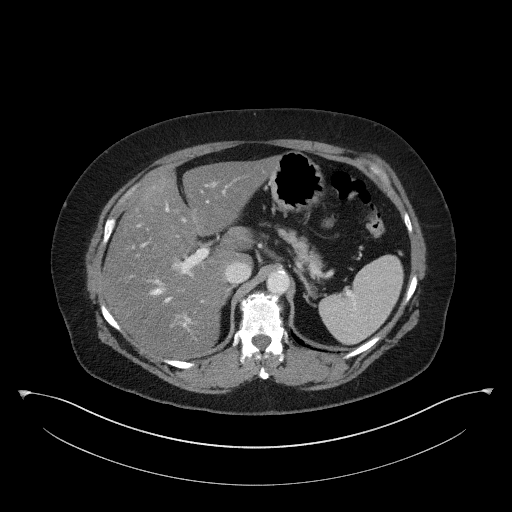
[im 75/99  bone]
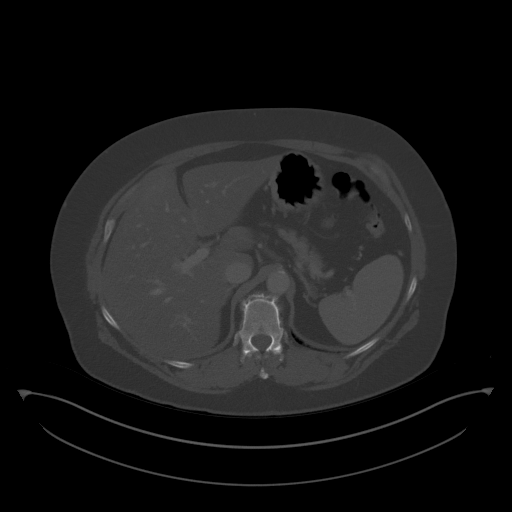
[im 85/99  soft-tissue]
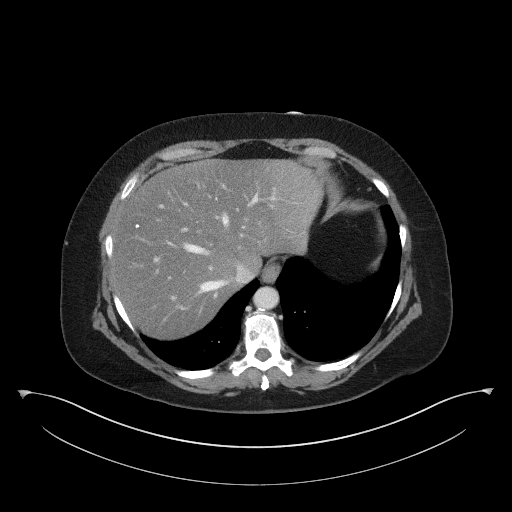
[im 94/99  soft-tissue]
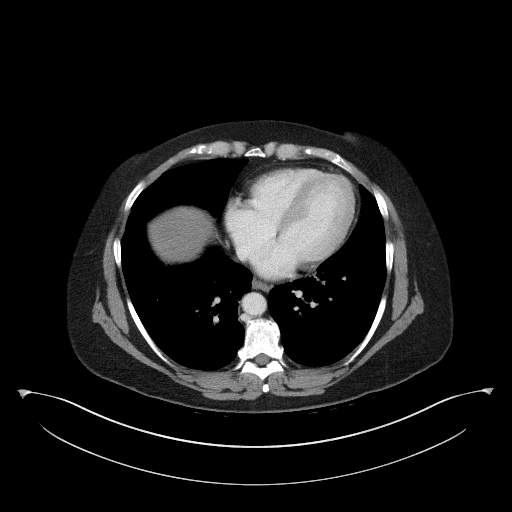

[Series 6: coronal st · coronal · 0.75mm/px · 3 of 96 slices shown]
[im 32/96  soft-tissue]
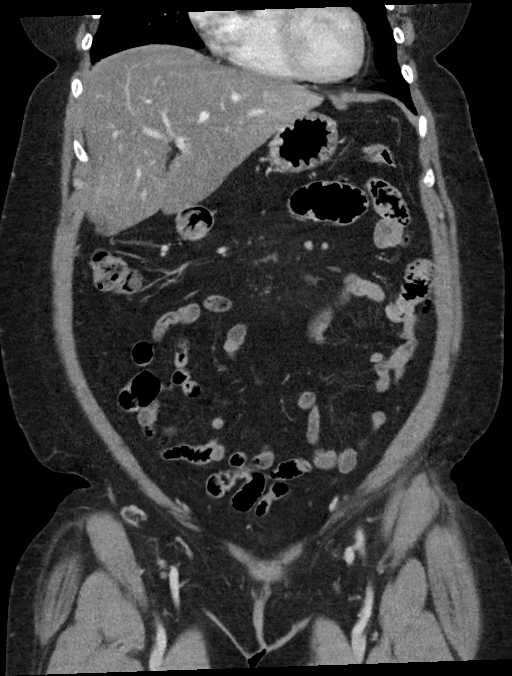
[im 43/96  soft-tissue]
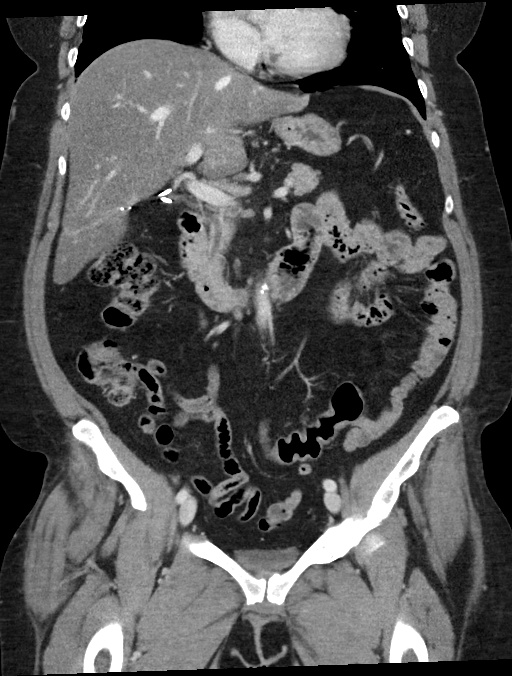
[im 53/96  soft-tissue]
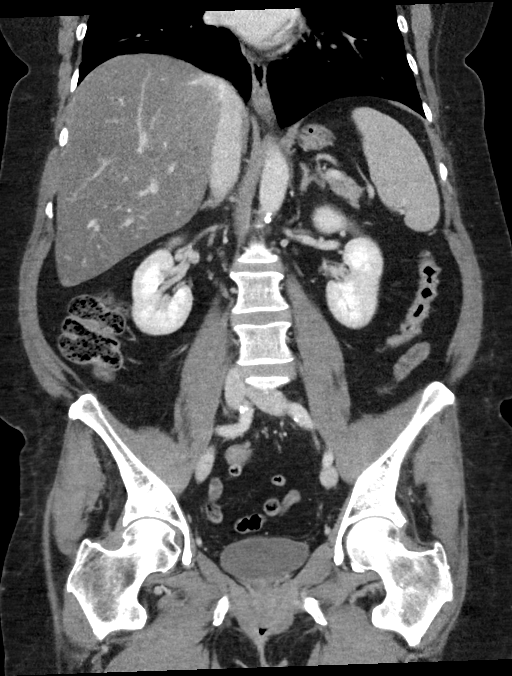

[14 of 46 positions shown; findings below may reference images not displayed]

FINDINGS: CTA CHEST FINDINGS

Cardiovascular: Calcified and noncalcified atheromatous plaque in
the thoracic aorta., changes are mild. No aneurysmal dilation of the
thoracic aorta. Heart size top normal without pericardial effusion.

No sign of pulmonary embolism. Central pulmonary vasculature is
normal caliber.

Mediastinum/Nodes: Mildly patulous esophagus. 12 mm subcarinal lymph
node. No hilar adenopathy, no thoracic inlet lymphadenopathy.

No axillary lymphadenopathy.

Lungs/Pleura: No sign of consolidation. No pleural effusion. Airways
are patent. Nodular density on the chest portion of the exam in the
RIGHT costodiaphragmatic sulcus, this is in the RIGHT middle lobe
and appears less nodular on the abdominal portion of the
examination, more geographic. Area of lingular scarring.

Musculoskeletal: No acute findings related to the bony structures of
the thorax. Post ORIF of the LEFT clavicle. Remote posttraumatic
changes of LEFT-sided ribs posteriorly. Signs of healed rib
fractures.

Review of the MIP images confirms the above findings.

CT ABDOMEN and PELVIS FINDINGS

Hepatobiliary: Marked hepatic steatosis. Post cholecystectomy. No
biliary duct distension.

Pancreas: Normal, without mass, inflammation or ductal dilatation.

Spleen: Spleen normal size and contour.

Adrenals/Urinary Tract: Adrenal glands are normal.

Symmetric renal enhancement. No suspicious renal lesion. No
hydronephrosis. No perinephric stranding. Urinary bladder under
distended limiting assessment.

Stomach/Bowel: Stomach unremarkable. Small bowel nondilated. Sigmoid
diverticular disease. Appendix not visible but there are no
secondary signs of acute appendicitis.

No signs of pericolonic stranding. Under distension of distal colon.

Vascular/Lymphatic: Calcified atheromatous plaque of the abdominal
aorta. There is no gastrohepatic or hepatoduodenal ligament
lymphadenopathy. No retroperitoneal or mesenteric lymphadenopathy.

No pelvic sidewall lymphadenopathy.

Reproductive: Post hysterectomy.

Other: No ascites.

Musculoskeletal: No acute musculoskeletal process. Spinal
degenerative changes. Posttraumatic changes about the LEFT pubic
bone, healed fractures in this area related to prior trauma.

Review of the MIP images confirms the above findings.
IMPRESSION: 1. Negative for pulmonary embolism.
2. Nodular density on the chest portion of the exam in the RIGHT
middle lobe appears less nodular on the abdominal portion of the
examination. This is favored to represent an area of scarring or
pneumonitis.
3. 12 mm subcarinal lymph node, nonspecific, potentially reactive;
however, given size would suggest a 3 month follow-up at which time
attention can be directed to pulmonary findings which again are
likely related to scarring or pneumonitis.
4. Marked hepatic steatosis.
5. Post cholecystectomy and hysterectomy.
6. Sigmoid diverticular disease.
7. Signs of prior trauma to the LEFT hemithorax and LEFT pelvis.

Aortic Atherosclerosis ([id number]).

## 2023-12-04 ENCOUNTER — Encounter: Payer: Self-pay | Admitting: Emergency Medicine

## 2023-12-04 ENCOUNTER — Emergency Department
Admission: EM | Admit: 2023-12-04 | Discharge: 2023-12-04 | Disposition: A | Discharge status: Home / Self Care | Attending: Emergency Medicine | Admitting: Emergency Medicine

## 2023-12-04 ENCOUNTER — Other Ambulatory Visit: Payer: Self-pay

## 2023-12-04 DIAGNOSIS — R21 Rash and other nonspecific skin eruption: Secondary | ICD-10-CM | POA: Insufficient documentation

## 2023-12-04 DIAGNOSIS — I10 Essential (primary) hypertension: Secondary | ICD-10-CM | POA: Diagnosis not present

## 2023-12-04 MED ORDER — AMLODIPINE BESYLATE 5 MG PO TABS: 5.0000 mg | ORAL_TABLET | Freq: Every day | ORAL | 2 refills | Status: AC

## 2023-12-04 MED ORDER — CLOTRIMAZOLE-BETAMETHASONE 1-0.05 % EX CREA: 1.0000 | TOPICAL_CREAM | Freq: Two times a day (BID) | CUTANEOUS | 0 refills | Status: AC

## 2023-12-04 NOTE — ED Notes (Signed)
 See triage note  Presents with rash to lower legs with itching and also a possible spider bite to right upper thigh  Area is red and tender Denies any fever

## 2023-12-04 NOTE — ED Provider Notes (Signed)
 [facility] Provider Note    Event Date/Time   First MD Initiated Contact with Patient 12/04/23 0900     (approximate)   History   Rash   HPI  Ariana Cook is a 63 y.o. female no significant past medical history presents the emergency department with itching rash.  States that the rash has been present for the past couple of months.  Does state that it is itching.  Also complaining of a another wound to her right hip and she was concerned she had a spider bite.  No fever or systemic symptoms.  Denies any chest pain or shortness of breath.  No abdominal pain.  Has not seen a primary care physician in a very long time.     Physical Exam   Triage Vital Signs: ED Triage Vitals  Encounter Vitals Group     BP 12/04/23 0854 (!) 189/99     Girls Systolic BP Percentile --      Girls Diastolic BP Percentile --      Boys Systolic BP Percentile --      Boys Diastolic BP Percentile --      Pulse Rate 12/04/23 0854 71     Resp 12/04/23 0854 16     Temp 12/04/23 0854 97.8 F (36.6 C)     Temp Source 12/04/23 0854 Oral     SpO2 12/04/23 0854 99 %     Weight 12/04/23 0853 167 lb 15.9 oz (76.2 kg)     Height 12/04/23 0900 5' 2 (1.575 m)     Head Circumference --      Peak Flow --      Pain Score 12/04/23 0853 0     Pain Loc --      Pain Education --      Exclude from Growth Chart --     Most recent vital signs: Vitals:   12/04/23 0854  BP: (!) 189/99  Pulse: 71  Resp: 16  Temp: 97.8 F (36.6 C)  SpO2: 99%    Physical Exam Constitutional:      Appearance: She is well-developed.  HENT:     Head: Atraumatic.  Eyes:     Conjunctiva/sclera: Conjunctivae normal.  Cardiovascular:     Rate and Rhythm: Regular rhythm.  Pulmonary:     Effort: No respiratory distress.  Abdominal:     General: There is no distension.  Musculoskeletal:        General: Normal range of motion.     Cervical back: Normal range of motion.  Skin:    General: Skin is  warm.     Findings: Rash present.     Comments: Scaling rash to the left anterior shin and lateral shin of the lower leg.  No surrounding erythema warmth or induration.  No crepitance.  No bulla.  Right upper leg with small wound surrounding with some mild erythema.  No induration and no purulent drainage.  Neurological:     Mental Status: She is alert. Mental status is at baseline.      IMPRESSION / MDM / ASSESSMENT AND PLAN / ED COURSE  I reviewed the triage vital signs and the nursing notes.  Differential diagnosis including eczema, psoriasis, insect bite, spider bite   Labs (all labs ordered are listed, but only abnormal results are displayed) Labs interpreted as -    Labs Reviewed - No data to display  Will start the patient on topical steroid cream given significant concern for psoriasis.  No findings concerning for necrotizing soft tissue infection and no concern for cellulitis.  Do not feel that antibiotics are necessary at this time.  Otherwise well-appearing.  Noted to have significant elevation of her blood pressure in the emergency department.  On chart review patient has had an elevated blood pressure every ER visit for multiple years.  On chart review patient has had up normal kidney function in the past.  Asymptomatic otherwise.  Will start the patient on antihypertensive medications and give the 35-month supply with amlodipine.  Given a referral for primary care and list of providers in the area.  Discussed return precautions for any ongoing or worsening symptoms.  No questions at time of discharge.     PROCEDURES:  Critical Care performed: No  Procedures  Patient's presentation is most consistent with acute complicated illness / injury requiring diagnostic workup.   MEDICATIONS ORDERED IN ED: Medications - No data to display  FINAL CLINICAL IMPRESSION(S) / ED DIAGNOSES   Final diagnoses:  Rash  Uncontrolled hypertension     Rx / DC Orders   ED  Discharge Orders          Ordered    Ambulatory Referral to Primary Care (Establish Care)  Status:  Canceled        12/04/23 0931    clotrimazole-betamethasone (LOTRISONE) cream  2 times daily        12/04/23 0931    Ambulatory Referral to Primary Care (Establish Care)        12/04/23 0937    amLODipine (NORVASC) 5 MG tablet  Daily        12/04/23 9061             Note:  This document was prepared using Dragon voice recognition software and may include unintentional dictation errors.   Suzanne Kirsch, MD 12/04/23 [phone]

## 2023-12-04 NOTE — Discharge Instructions (Addendum)
 You are seen in the emerged department for a rash to your lower legs.  Concern that this could be atopic dermatitis versus psoriasis.  You are given a steroid cream to place on the rash twice a day.  Your blood pressure was significantly elevated in the emergency department and has been elevated every time you have been to the emergency department.  You were started on a blood pressure medication called amlodipine.  Take it every day.  You need to follow-up with primary care physician so they can recheck your blood pressure and do lab work.  Return for any ongoing or worsening symptoms.  Follow-up closely with your primary care physician, you were given a list of primary care providers in the area accepting new patients.   Return to the emergency department for any signs of infection.  You can keep triple antibiotic ointment like Neosporin or bacitracin to your spider bite to your right leg.  Pain control:  Ibuprofen  (motrin /aleve /advil ) - You can take 3 tablets (600 mg) every 6 hours as needed for pain/fever.  Acetaminophen  (tylenol ) - You can take 2 extra strength tablets (1000 mg) every 6 hours as needed for pain/fever.  You can alternate these medications or take them together.  Make sure you eat food/drink water when taking these medications.

## 2023-12-04 NOTE — ED Triage Notes (Signed)
 C/O itchy rash to left lower leg x 6 months and possible spider bite to right hip/thigh x 4 days.  Red patches seen to left lower leg and circular area reddened with dark center to right hip.
